# Patient Record
Sex: Female | Born: 1935 | Race: White | Hispanic: No | Marital: Married | Smoking: Former smoker
Health system: Southern US, Academic
[De-identification: ages and names within clinical notes are randomized; demographics above are authoritative.]

---

## 1998-05-05 ENCOUNTER — Ambulatory Visit (INDEPENDENT_AMBULATORY_CARE_PROVIDER_SITE_OTHER): Payer: Self-pay

## 1999-07-14 ENCOUNTER — Ambulatory Visit (INDEPENDENT_AMBULATORY_CARE_PROVIDER_SITE_OTHER): Payer: Self-pay

## 1999-08-18 ENCOUNTER — Ambulatory Visit (INDEPENDENT_AMBULATORY_CARE_PROVIDER_SITE_OTHER): Payer: Self-pay

## 1999-09-08 ENCOUNTER — Other Ambulatory Visit: Payer: Self-pay

## 1999-09-08 ENCOUNTER — Ambulatory Visit (INDEPENDENT_AMBULATORY_CARE_PROVIDER_SITE_OTHER): Payer: Self-pay | Admitting: Family Medicine

## 1999-09-20 ENCOUNTER — Ambulatory Visit (HOSPITAL_COMMUNITY): Payer: Self-pay | Admitting: Pulmonary Disease

## 1999-10-11 ENCOUNTER — Ambulatory Visit (HOSPITAL_COMMUNITY): Payer: Self-pay | Admitting: Pulmonary Disease

## 1999-10-29 ENCOUNTER — Ambulatory Visit (INDEPENDENT_AMBULATORY_CARE_PROVIDER_SITE_OTHER): Payer: Self-pay | Admitting: Family Medicine

## 1999-11-22 ENCOUNTER — Ambulatory Visit (INDEPENDENT_AMBULATORY_CARE_PROVIDER_SITE_OTHER): Payer: Self-pay

## 1999-12-03 ENCOUNTER — Ambulatory Visit (HOSPITAL_BASED_OUTPATIENT_CLINIC_OR_DEPARTMENT_OTHER): Payer: Self-pay | Admitting: Family Medicine

## 1999-12-24 ENCOUNTER — Ambulatory Visit (INDEPENDENT_AMBULATORY_CARE_PROVIDER_SITE_OTHER): Payer: Self-pay | Admitting: Family Medicine

## 2000-01-06 ENCOUNTER — Ambulatory Visit (INDEPENDENT_AMBULATORY_CARE_PROVIDER_SITE_OTHER): Payer: Self-pay

## 2000-02-15 ENCOUNTER — Ambulatory Visit (INDEPENDENT_AMBULATORY_CARE_PROVIDER_SITE_OTHER): Payer: Self-pay

## 2000-03-07 ENCOUNTER — Ambulatory Visit (HOSPITAL_BASED_OUTPATIENT_CLINIC_OR_DEPARTMENT_OTHER): Payer: Self-pay | Admitting: Dermatology

## 2000-03-14 ENCOUNTER — Ambulatory Visit (HOSPITAL_BASED_OUTPATIENT_CLINIC_OR_DEPARTMENT_OTHER): Payer: Self-pay | Admitting: Dermatology

## 2000-03-17 ENCOUNTER — Ambulatory Visit (INDEPENDENT_AMBULATORY_CARE_PROVIDER_SITE_OTHER): Payer: Self-pay

## 2000-03-24 ENCOUNTER — Ambulatory Visit (INDEPENDENT_AMBULATORY_CARE_PROVIDER_SITE_OTHER): Payer: Self-pay | Admitting: Family Medicine

## 2000-04-10 ENCOUNTER — Ambulatory Visit (HOSPITAL_COMMUNITY): Payer: Self-pay | Admitting: Pulmonary Disease

## 2000-08-28 ENCOUNTER — Ambulatory Visit (HOSPITAL_COMMUNITY): Payer: Self-pay | Admitting: Pulmonary Disease

## 2000-10-26 ENCOUNTER — Ambulatory Visit (INDEPENDENT_AMBULATORY_CARE_PROVIDER_SITE_OTHER): Payer: Self-pay

## 2001-01-29 ENCOUNTER — Ambulatory Visit (HOSPITAL_COMMUNITY): Payer: Self-pay | Admitting: Pulmonary Disease

## 2001-02-08 ENCOUNTER — Ambulatory Visit (INDEPENDENT_AMBULATORY_CARE_PROVIDER_SITE_OTHER): Payer: Self-pay

## 2001-03-07 ENCOUNTER — Other Ambulatory Visit: Payer: Self-pay

## 2001-03-07 ENCOUNTER — Ambulatory Visit (HOSPITAL_BASED_OUTPATIENT_CLINIC_OR_DEPARTMENT_OTHER): Payer: Self-pay | Admitting: Family Medicine

## 2001-03-09 ENCOUNTER — Ambulatory Visit (INDEPENDENT_AMBULATORY_CARE_PROVIDER_SITE_OTHER): Payer: Self-pay | Admitting: Family Medicine

## 2001-04-24 ENCOUNTER — Ambulatory Visit (INDEPENDENT_AMBULATORY_CARE_PROVIDER_SITE_OTHER): Payer: Self-pay | Admitting: DERMATOLOGY

## 2001-07-23 ENCOUNTER — Ambulatory Visit (HOSPITAL_COMMUNITY): Payer: Self-pay | Admitting: Pulmonary Disease

## 2001-07-30 ENCOUNTER — Ambulatory Visit (INDEPENDENT_AMBULATORY_CARE_PROVIDER_SITE_OTHER): Payer: Self-pay | Admitting: Family Medicine

## 2001-08-14 ENCOUNTER — Ambulatory Visit (INDEPENDENT_AMBULATORY_CARE_PROVIDER_SITE_OTHER): Payer: Self-pay | Admitting: Family Medicine

## 2001-11-13 ENCOUNTER — Ambulatory Visit (INDEPENDENT_AMBULATORY_CARE_PROVIDER_SITE_OTHER): Payer: Self-pay | Admitting: Family Medicine

## 2001-11-23 ENCOUNTER — Ambulatory Visit (INDEPENDENT_AMBULATORY_CARE_PROVIDER_SITE_OTHER): Payer: Self-pay | Admitting: Family Medicine

## 2001-12-28 ENCOUNTER — Ambulatory Visit (INDEPENDENT_AMBULATORY_CARE_PROVIDER_SITE_OTHER): Payer: Self-pay | Admitting: Family Medicine

## 2002-01-21 ENCOUNTER — Ambulatory Visit (HOSPITAL_COMMUNITY): Payer: Self-pay | Admitting: Pulmonary Disease

## 2002-03-05 ENCOUNTER — Ambulatory Visit (INDEPENDENT_AMBULATORY_CARE_PROVIDER_SITE_OTHER): Payer: Self-pay | Admitting: Family Medicine

## 2002-03-08 ENCOUNTER — Ambulatory Visit (INDEPENDENT_AMBULATORY_CARE_PROVIDER_SITE_OTHER): Payer: Self-pay

## 2002-05-14 ENCOUNTER — Ambulatory Visit (INDEPENDENT_AMBULATORY_CARE_PROVIDER_SITE_OTHER): Payer: Self-pay | Admitting: Family Medicine

## 2002-05-21 ENCOUNTER — Ambulatory Visit (INDEPENDENT_AMBULATORY_CARE_PROVIDER_SITE_OTHER): Payer: Self-pay | Admitting: Family Medicine

## 2002-05-31 ENCOUNTER — Ambulatory Visit (INDEPENDENT_AMBULATORY_CARE_PROVIDER_SITE_OTHER): Payer: Self-pay | Admitting: Family Medicine

## 2002-06-04 ENCOUNTER — Ambulatory Visit (INDEPENDENT_AMBULATORY_CARE_PROVIDER_SITE_OTHER): Payer: Self-pay

## 2002-07-22 ENCOUNTER — Ambulatory Visit (HOSPITAL_COMMUNITY): Payer: Self-pay | Admitting: Pulmonary Disease

## 2002-07-23 ENCOUNTER — Ambulatory Visit (INDEPENDENT_AMBULATORY_CARE_PROVIDER_SITE_OTHER): Payer: Self-pay | Admitting: Family Medicine

## 2002-09-20 ENCOUNTER — Ambulatory Visit (INDEPENDENT_AMBULATORY_CARE_PROVIDER_SITE_OTHER): Payer: Self-pay | Admitting: Family Medicine

## 2003-01-03 ENCOUNTER — Ambulatory Visit (INDEPENDENT_AMBULATORY_CARE_PROVIDER_SITE_OTHER): Payer: Self-pay | Admitting: Family Medicine

## 2003-01-22 ENCOUNTER — Ambulatory Visit (HOSPITAL_COMMUNITY): Payer: Self-pay | Admitting: Pulmonary Disease

## 2003-02-17 ENCOUNTER — Ambulatory Visit (INDEPENDENT_AMBULATORY_CARE_PROVIDER_SITE_OTHER): Payer: Self-pay | Admitting: Family Medicine

## 2003-03-07 ENCOUNTER — Ambulatory Visit (INDEPENDENT_AMBULATORY_CARE_PROVIDER_SITE_OTHER): Payer: Self-pay | Admitting: Family Medicine

## 2003-05-28 ENCOUNTER — Ambulatory Visit (INDEPENDENT_AMBULATORY_CARE_PROVIDER_SITE_OTHER): Payer: Self-pay | Admitting: Family Medicine

## 2003-06-23 ENCOUNTER — Ambulatory Visit (INDEPENDENT_AMBULATORY_CARE_PROVIDER_SITE_OTHER): Payer: Self-pay | Admitting: Family Medicine

## 2003-07-21 ENCOUNTER — Ambulatory Visit (HOSPITAL_COMMUNITY): Payer: Self-pay | Admitting: Pulmonary Disease

## 2003-10-13 ENCOUNTER — Ambulatory Visit (INDEPENDENT_AMBULATORY_CARE_PROVIDER_SITE_OTHER): Payer: Self-pay | Admitting: Family Medicine

## 2004-01-26 ENCOUNTER — Ambulatory Visit (HOSPITAL_COMMUNITY): Payer: Self-pay | Admitting: Pulmonary Disease

## 2004-03-16 ENCOUNTER — Ambulatory Visit (INDEPENDENT_AMBULATORY_CARE_PROVIDER_SITE_OTHER): Payer: Self-pay

## 2004-06-16 ENCOUNTER — Other Ambulatory Visit (HOSPITAL_COMMUNITY): Payer: Self-pay

## 2004-07-13 ENCOUNTER — Ambulatory Visit (INDEPENDENT_AMBULATORY_CARE_PROVIDER_SITE_OTHER): Payer: Self-pay

## 2004-08-09 ENCOUNTER — Ambulatory Visit (INDEPENDENT_AMBULATORY_CARE_PROVIDER_SITE_OTHER): Payer: Self-pay

## 2004-09-06 ENCOUNTER — Ambulatory Visit (HOSPITAL_COMMUNITY): Payer: Self-pay

## 2004-09-09 ENCOUNTER — Ambulatory Visit (INDEPENDENT_AMBULATORY_CARE_PROVIDER_SITE_OTHER): Payer: Self-pay

## 2004-09-21 ENCOUNTER — Other Ambulatory Visit (INDEPENDENT_AMBULATORY_CARE_PROVIDER_SITE_OTHER): Payer: Self-pay

## 2004-09-24 ENCOUNTER — Ambulatory Visit (INDEPENDENT_AMBULATORY_CARE_PROVIDER_SITE_OTHER): Payer: Self-pay | Admitting: Family Medicine

## 2004-10-14 ENCOUNTER — Ambulatory Visit (INDEPENDENT_AMBULATORY_CARE_PROVIDER_SITE_OTHER): Payer: Self-pay

## 2005-04-05 ENCOUNTER — Ambulatory Visit (INDEPENDENT_AMBULATORY_CARE_PROVIDER_SITE_OTHER): Payer: Self-pay | Admitting: Infectious Disease

## 2005-07-06 ENCOUNTER — Ambulatory Visit (INDEPENDENT_AMBULATORY_CARE_PROVIDER_SITE_OTHER): Payer: Self-pay | Admitting: Infectious Disease

## 2005-07-11 ENCOUNTER — Ambulatory Visit (INDEPENDENT_AMBULATORY_CARE_PROVIDER_SITE_OTHER): Payer: Self-pay | Admitting: Family Medicine

## 2005-07-26 ENCOUNTER — Ambulatory Visit (HOSPITAL_BASED_OUTPATIENT_CLINIC_OR_DEPARTMENT_OTHER): Payer: Self-pay | Admitting: Dermatology

## 2005-08-19 ENCOUNTER — Ambulatory Visit (INDEPENDENT_AMBULATORY_CARE_PROVIDER_SITE_OTHER): Payer: Self-pay

## 2005-09-06 ENCOUNTER — Ambulatory Visit (INDEPENDENT_AMBULATORY_CARE_PROVIDER_SITE_OTHER): Payer: Self-pay | Admitting: Infectious Disease

## 2005-09-08 ENCOUNTER — Ambulatory Visit (INDEPENDENT_AMBULATORY_CARE_PROVIDER_SITE_OTHER): Payer: Self-pay | Admitting: Infectious Disease

## 2005-09-28 ENCOUNTER — Ambulatory Visit (INDEPENDENT_AMBULATORY_CARE_PROVIDER_SITE_OTHER): Payer: Self-pay | Admitting: Family Medicine

## 2005-10-10 ENCOUNTER — Ambulatory Visit (HOSPITAL_COMMUNITY): Payer: Self-pay | Admitting: Pulmonary Disease

## 2005-10-20 ENCOUNTER — Ambulatory Visit (HOSPITAL_COMMUNITY): Payer: Self-pay | Admitting: Pulmonary Disease

## 2006-02-21 ENCOUNTER — Ambulatory Visit (INDEPENDENT_AMBULATORY_CARE_PROVIDER_SITE_OTHER): Payer: Self-pay | Admitting: Infectious Disease

## 2006-03-09 ENCOUNTER — Ambulatory Visit (INDEPENDENT_AMBULATORY_CARE_PROVIDER_SITE_OTHER): Payer: Self-pay | Admitting: Infectious Disease

## 2006-03-21 ENCOUNTER — Ambulatory Visit (INDEPENDENT_AMBULATORY_CARE_PROVIDER_SITE_OTHER): Payer: Self-pay

## 2006-04-17 ENCOUNTER — Ambulatory Visit (HOSPITAL_COMMUNITY): Payer: Self-pay | Admitting: Pulmonary Disease

## 2006-08-01 ENCOUNTER — Other Ambulatory Visit (INDEPENDENT_AMBULATORY_CARE_PROVIDER_SITE_OTHER): Payer: Self-pay | Admitting: Infectious Disease

## 2006-08-21 ENCOUNTER — Ambulatory Visit
Admit: 2006-08-21 | Discharge: 2006-08-21 | Disposition: A | Discharge status: Home / Self Care | Payer: No Typology Code available for payment source | Attending: Infectious Disease | Admitting: Infectious Disease

## 2006-08-21 DIAGNOSIS — A319 Mycobacterial infection, unspecified: Secondary | ICD-10-CM | POA: Insufficient documentation

## 2006-09-08 ENCOUNTER — Ambulatory Visit
Admission: RE | Admit: 2006-09-08 | Discharge: 2006-09-08 | Disposition: A | Discharge status: Home / Self Care | Payer: No Typology Code available for payment source | Attending: Infectious Disease | Admitting: Infectious Disease

## 2006-09-08 DIAGNOSIS — A318 Other mycobacterial infections: Secondary | ICD-10-CM | POA: Insufficient documentation

## 2006-09-13 ENCOUNTER — Encounter (INDEPENDENT_AMBULATORY_CARE_PROVIDER_SITE_OTHER): Payer: Self-pay | Admitting: Infectious Disease

## 2006-09-14 ENCOUNTER — Encounter (INDEPENDENT_AMBULATORY_CARE_PROVIDER_SITE_OTHER): Payer: No Typology Code available for payment source | Admitting: Infectious Disease

## 2006-10-16 ENCOUNTER — Encounter (INDEPENDENT_AMBULATORY_CARE_PROVIDER_SITE_OTHER): Payer: No Typology Code available for payment source | Admitting: Pulmonary Disease

## 2006-10-25 ENCOUNTER — Ambulatory Visit
Admission: RE | Admit: 2006-10-25 | Discharge: 2006-10-25 | Disposition: A | Discharge status: Home / Self Care | Payer: No Typology Code available for payment source

## 2006-10-25 ENCOUNTER — Other Ambulatory Visit (HOSPITAL_COMMUNITY): Payer: Self-pay

## 2006-10-25 DIAGNOSIS — Z1231 Encounter for screening mammogram for malignant neoplasm of breast: Secondary | ICD-10-CM | POA: Insufficient documentation

## 2006-10-25 DIAGNOSIS — Z803 Family history of malignant neoplasm of breast: Secondary | ICD-10-CM | POA: Insufficient documentation

## 2007-03-12 ENCOUNTER — Encounter (INDEPENDENT_AMBULATORY_CARE_PROVIDER_SITE_OTHER): Payer: No Typology Code available for payment source | Admitting: Pulmonary Disease

## 2007-03-21 ENCOUNTER — Ambulatory Visit
Admission: RE | Admit: 2007-03-21 | Discharge: 2007-03-21 | Disposition: A | Discharge status: Home / Self Care | Payer: No Typology Code available for payment source | Attending: Dermatology | Admitting: Dermatology

## 2007-03-21 ENCOUNTER — Other Ambulatory Visit (INDEPENDENT_AMBULATORY_CARE_PROVIDER_SITE_OTHER): Payer: Self-pay | Admitting: Family Medicine

## 2007-03-21 DIAGNOSIS — L820 Inflamed seborrheic keratosis: Secondary | ICD-10-CM | POA: Insufficient documentation

## 2007-03-26 LAB — SURGICAL PATHOLOGY SPECIMEN

## 2007-09-25 ENCOUNTER — Encounter (HOSPITAL_BASED_OUTPATIENT_CLINIC_OR_DEPARTMENT_OTHER): Payer: Self-pay | Admitting: Dermatology

## 2007-09-25 ENCOUNTER — Ambulatory Visit
Admission: RE | Admit: 2007-09-25 | Discharge: 2007-09-25 | Disposition: A | Discharge status: Home / Self Care | Payer: Medicare Other | Attending: Dermatology | Admitting: Dermatology

## 2007-09-25 DIAGNOSIS — L821 Other seborrheic keratosis: Secondary | ICD-10-CM | POA: Insufficient documentation

## 2007-09-25 DIAGNOSIS — Z85828 Personal history of other malignant neoplasm of skin: Secondary | ICD-10-CM | POA: Insufficient documentation

## 2007-09-25 DIAGNOSIS — Z09 Encounter for follow-up examination after completed treatment for conditions other than malignant neoplasm: Secondary | ICD-10-CM | POA: Insufficient documentation

## 2007-09-25 NOTE — Progress Notes (Signed)
[facility]      CANCER CENTER NOTE    PATIENT NAME: Lindsay Ryan, Lindsay Ryan [facility] ZOXWRU:[id number]  DATE OF SERVICE:09/25/2007  DATE OF BIRTH: Oct 11, 1933    SUBJECTIVE: The patient is a 74 year old female with a history of multiple non-melanoma skin cancers. She is here for her routine checkup.    OBJECTIVE: Examination of the face, arms, chest and back reveals no abnormalities or any recurrences of the skin cancers except for multiple dark, stuck-on, keratotic lesions on her chest and back.    ASSESSMENT: The patient has a history of multiple basal cell carcinomas. She has multiple seborrheic keratoses of her chest and back.    PLAN: I would like to see the patient every 6 months. She is moving to [place]. She plans on frequenting [place]. On her returns to [facility], she is to set up a visit to see me approximately every 6 months.      Evalee Mutton, MD  Professor  [facility] Department of Medicine    [id number]; D: 09/25/2007 10:54:45 T: 09/25/2007 20:06:10

## 2007-09-26 ENCOUNTER — Ambulatory Visit (HOSPITAL_BASED_OUTPATIENT_CLINIC_OR_DEPARTMENT_OTHER): Payer: Medicare Other | Admitting: Dermatology

## 2007-11-05 ENCOUNTER — Ambulatory Visit (INDEPENDENT_AMBULATORY_CARE_PROVIDER_SITE_OTHER): Payer: Self-pay | Admitting: Infectious Disease

## 2007-11-05 NOTE — Telephone Encounter (Signed)
Triage Queue message copied by Baker Pierini on Mon Nov 05, 2007 9:53 AM  ------   Message from: Gibson Ramp   Created: Mon Nov 05, 2007 9:53 AM    >> Gibson Ramp ZOX Nov 05, 2007 9:53 am  Dr.Sarwari  The pt states that she had a ct scan done and there are some changes. She is requesting a return call.

## 2007-11-05 NOTE — Telephone Encounter (Signed)
Called and discussed with patient - she recently had a regular physical at [facility] and based on symptoms of mild cough, had a repeat CT chest.  This has not yet been compared to her last CT with [place] in April 2008 but she is coordinating this comparision.  There were some concern about the activity/relapse of her pulmonary MAC which had been treated with 2 years of Azithromycin/Etambutol till last year.    Discussed and agreed to proceed with collection of 3 AFB specimens to document any microbiological relapse.

## 2007-11-12 ENCOUNTER — Other Ambulatory Visit
Admission: RE | Admit: 2007-11-12 | Discharge: 2007-11-12 | Disposition: A | Discharge status: Home / Self Care | Payer: Medicare FFS | Attending: Infectious Disease | Admitting: Infectious Disease

## 2007-11-12 DIAGNOSIS — Z79899 Other long term (current) drug therapy: Secondary | ICD-10-CM | POA: Insufficient documentation

## 2007-11-15 ENCOUNTER — Other Ambulatory Visit (HOSPITAL_COMMUNITY): Payer: Self-pay

## 2007-11-20 ENCOUNTER — Ambulatory Visit
Admission: RE | Admit: 2007-11-20 | Discharge: 2007-11-20 | Disposition: A | Discharge status: Home / Self Care | Payer: Medicare FFS

## 2007-11-20 ENCOUNTER — Ambulatory Visit (INDEPENDENT_AMBULATORY_CARE_PROVIDER_SITE_OTHER)
Admission: RE | Admit: 2007-11-20 | Discharge: 2007-11-20 | Disposition: A | Discharge status: Home / Self Care | Payer: Medicare FFS | Source: Ambulatory Visit | Admitting: Rheumatology

## 2007-11-20 DIAGNOSIS — E7800 Pure hypercholesterolemia, unspecified: Secondary | ICD-10-CM | POA: Insufficient documentation

## 2007-11-20 DIAGNOSIS — Z5181 Encounter for therapeutic drug level monitoring: Secondary | ICD-10-CM | POA: Insufficient documentation

## 2007-11-20 DIAGNOSIS — Z1231 Encounter for screening mammogram for malignant neoplasm of breast: Secondary | ICD-10-CM | POA: Insufficient documentation

## 2007-11-20 DIAGNOSIS — Z803 Family history of malignant neoplasm of breast: Secondary | ICD-10-CM | POA: Insufficient documentation

## 2007-11-20 LAB — HEPATIC FUNCTION PANEL
ALBUMIN: 3.4 g/dL (ref 3.2–4.4)
ALKALINE PHOSPHATASE: 103 U/L (ref 38–126)
ALT (SGPT): 23 U/L (ref 6–35)
AST (SGOT): 23 U/L (ref 5–30)
BILIRUBIN,CONJUGATED: 0.1 mg/dL (ref 0.0–0.3)
TOTAL PROTEIN: 6.8 g/dL (ref 6.4–8.3)

## 2007-11-20 LAB — LIPID PANEL
CHOLESTEROL: 177 mg/dL (ref <200)
LDL (CALCULATED): 116 mg/dL — ABNORMAL HIGH (ref <100)

## 2007-11-20 LAB — CREATINE KINASE (CK), TOTAL, SERUM OR PLASMA: CREATINE KINASE (CK): 59 U/L (ref 24–170)

## 2007-11-23 ENCOUNTER — Ambulatory Visit
Admission: RE | Admit: 2007-11-23 | Discharge: 2007-11-23 | Disposition: A | Discharge status: Home / Self Care | Payer: Medicare FFS

## 2007-11-28 LAB — AFB CULTURE WITH STAIN: ACID FAST SMEAR: NONE SEEN

## 2007-11-28 LAB — MYCOBACTERIAL IDENTIFICATION

## 2007-11-30 ENCOUNTER — Ambulatory Visit
Admission: RE | Admit: 2007-11-30 | Discharge: 2007-11-30 | Disposition: A | Discharge status: Home / Self Care | Payer: Medicare Other

## 2007-12-10 ENCOUNTER — Encounter (INDEPENDENT_AMBULATORY_CARE_PROVIDER_SITE_OTHER): Payer: Medicare Other | Admitting: Pulmonary Disease

## 2007-12-21 ENCOUNTER — Encounter (INDEPENDENT_AMBULATORY_CARE_PROVIDER_SITE_OTHER): Payer: Medicare FFS | Admitting: Infectious Disease

## 2007-12-24 ENCOUNTER — Ambulatory Visit
Admission: RE | Admit: 2007-12-24 | Discharge: 2007-12-24 | Disposition: A | Discharge status: Home / Self Care | Payer: Medicare FFS | Attending: Infectious Disease | Admitting: Infectious Disease

## 2007-12-24 DIAGNOSIS — A310 Pulmonary mycobacterial infection: Secondary | ICD-10-CM | POA: Insufficient documentation

## 2007-12-24 LAB — ARUP MISC INT TEST 1-AMBIENT: ARUP TEST NUMBER: 60217

## 2007-12-24 LAB — AFB CULTURE WITH STAIN
ACID FAST SMEAR: NONE SEEN
ACID FAST SMEAR: NONE SEEN
CULTURE OBSERVATION: NO GROWTH
CULTURE OBSERVATION: NO GROWTH
REPORT STATUS: 8172009
REPORT STATUS: 8172009

## 2007-12-25 ENCOUNTER — Ambulatory Visit
Admission: RE | Admit: 2007-12-25 | Discharge: 2007-12-25 | Disposition: A | Discharge status: Home / Self Care | Payer: Medicare FFS | Attending: Infectious Disease | Admitting: Infectious Disease

## 2007-12-25 DIAGNOSIS — A310 Pulmonary mycobacterial infection: Secondary | ICD-10-CM | POA: Insufficient documentation

## 2007-12-25 NOTE — Progress Notes (Signed)
 [facility] Department of Medicine  [address]  [place], [place] [address]      PROGRESS NOTE    PATIENT NAME: Lindsay Ryan, Lindsay Ryan [facility]  CHART NUMBER: [id number]  DATE OF BIRTH: 17-Sep-1933  DATE OF SERVICE: 12/21/2007    IDENTIFYING DATA:  Lindsay Ryan is a 74 year old Caucasian female who follows up with myself and the Pulmonary Clinic for an underlying diagnosis of pulmonary Mycobacterium avium complex disease.  Her initial presentation was with a chronic cough with CT chest findings of bronchiectasis and pulmonary nodules.  In May 2008, the patient completed a two-year course of antibacterial therapy with azithromycin  500 mg per day along with Ethambutol  800 mg per day.  This was a modified regimen because her initial three drug regimen was complicated by drug allergies that included a rash, possibly secondary to clarithromycin , and elevated liver enzymes associated with rifampin and rifabutin.    In the past few weeks, the patient has been doing quite well physically.  She had a routine annual screening physical at the [facility], and based on symptoms of slight worsening in her cough underwent a repeat CT scan of her chest.  Based on the findings of this CT scan she was advised to seek consultation with myself for concerns that she may have relapsed pulmonary MAC.    We did have a subsequent telephone conversation and I had recommended to the patient that we should send off three sputum AFB samples, which she did in early July.  Subsequently, one of these three samples turned positive for Mycobacterium avium complex with susceptibilities currently pending.    SUBJECTIVE:  The patient presents to the clinic today to discuss our further course of action.  Again, she has been relatively asymptomatic, except for a slightly more prominent cough.  Her cough is not as bad as it was when we initially treated her for pulmonary Mycobacterium avium complex disease.  She does, however, get spells when the cough does  get worse.  She also has some exertional shortness of breath that has not changed by much.  She denies any drenching night sweats.  She otherwise has a good appetite and has been able to maintain a constant weight.  She overall does not feel sick.  However, she would like to consider repeat therapy if it is likely to prevent further worsening of her pulmonary process with worsening symptomatology.    REVIEW OF SYSTEMS:  Negative for any fever, chills or night sweats.  She retains a good appetite.  She does not have any chest pain.    OBJECTIVE:  On physical examination, she does indeed look well.  Her vital signs show a height of 5 foot 6.5 inches, weight 151.4 pounds, temperature 37.2 degrees centigrade, blood pressure of 126/72 mmHg.  Her general physical exam does not show any evidence of pallor, cyanosis, jaundice, lymphadenopathy, clubbing or lower extremity edema.  HEENT examination is unremarkable.  Chest exam showed kyphosis.  She has good bilateral air entry with normal vesicular breath sounds.  I am unable to hear any crackles.   Cardiovascular exam shows normal S1 and S2 with no murmurs.  Her abdomen is soft and nontender.  Extremity exam does not show any evidence of arthritis.  Skin exam shows no rash.    TESTS:  A review of her labs done on July 17th show a total bilirubin of 1.3, AST/ALT 23/33, and an alkaline phosphatase of 103.  CPK was 59.  Once again 3 sets of  sputum AFB were sent of which 1/3 is positive for MAC.      The patient also brings reports of her CT scan done at the [facility] on 10/04/07.   The scan interpretation shows multifocal bilateral pulmonary bronchiectasis and nodular opacities with tree in bud pattern.  There is also focal subsegmental airspace infiltrate in the superior segment of the right lower lobe.  This scan was subsequently compared to her end of treatment CT scan from [facility] done on 08/21/2006.  Based on this comparison, the report states that the multifocal  pulmonary bronchiectasis and nodular opacities were present previously and appear stable.  However, the airspace infiltrate in the superior segment of the right lower lobe does represent an interval change, likely reflecting active disease.    ASSESSMENT:  In summary, Lindsay Ryan is a 74 year old Caucasian female with underlying kyphosis and nodular and bronchiectatic pulmonary changes secondary to Mycobacterium avium complex disease, relapsed after a two-year treatment with azithromycin  and Ethambutol  that was completed in August 2008, now with CT and microbiologic evidence of relapse and progression.      PLAN:  I had a detailed and extensive discussion with the patient regarding our future course of action.  We are both inclined to consider retreatment, especially given that she is slightly symptomatic.  I suggested to the patient that we should wait until we have all susceptibility data.  I also suggested that I would like to collect three more sputum samples.  For our retreatment regimen, I would like to consider a rechallenge for the patient with clarithromycin .  If she does tolerate it then I would add Ethambutol  and potentially use a quinolone as a third drug.  I plan to follow up with the patient in another 2 months' time.      [name], MD  Associate Professor; Section of Infectious Disease  [place] Department of Medicine    [id number]; D: 12/21/2007 12:37:33; T: 12/25/2007 08:40:21    cc: Norleen Kitty MD      Lindsay Ryan

## 2007-12-26 ENCOUNTER — Other Ambulatory Visit
Admission: RE | Admit: 2007-12-26 | Discharge: 2007-12-26 | Disposition: A | Discharge status: Home / Self Care | Payer: Medicare FFS | Attending: Infectious Disease | Admitting: Infectious Disease

## 2007-12-26 DIAGNOSIS — A310 Pulmonary mycobacterial infection: Secondary | ICD-10-CM | POA: Insufficient documentation

## 2008-01-17 LAB — AFB CULTURE WITH STAIN: ACID FAST SMEAR: NONE SEEN

## 2008-01-17 LAB — MYCOBACTERIAL IDENTIFICATION

## 2008-01-28 LAB — ARUP MISC INT TEST 1-AMBIENT: ARUP TEST NUMBER: 60217

## 2008-02-01 ENCOUNTER — Telehealth (INDEPENDENT_AMBULATORY_CARE_PROVIDER_SITE_OTHER): Payer: Self-pay | Admitting: Infectious Disease

## 2008-02-01 NOTE — Telephone Encounter (Signed)
Received call from patient - running a temperature and having a productive cough.  Has pulmonary MAC that we are planning on retreating in the next 2 weeks.  Will go ahead and call in Augmentin [phone] BID to [facility].  Patient aware and agreeable.

## 2008-02-06 LAB — AFB CULTURE WITH STAIN
ACID FAST SMEAR: NONE SEEN
CULTURE OBSERVATION: NO GROWTH
REPORT STATUS: 9302009

## 2008-02-07 LAB — AFB CULTURE WITH STAIN: ACID FAST SMEAR: NONE SEEN

## 2008-02-14 ENCOUNTER — Ambulatory Visit (INDEPENDENT_AMBULATORY_CARE_PROVIDER_SITE_OTHER): Payer: Medicare FFS | Admitting: Infectious Disease

## 2008-02-14 MED ORDER — CLARITHROMYCIN 500 MG TABLET
500.00 mg | ORAL_TABLET | Freq: Two times a day (BID) | ORAL | Status: DC
Start: 2008-02-14 — End: 2009-02-26

## 2008-02-14 MED ORDER — ETHAMBUTOL 400 MG TABLET
800.0000 mg | ORAL_TABLET | Freq: Every day | ORAL | Status: DC
Start: 2008-02-14 — End: 2009-02-26

## 2008-02-14 MED ORDER — AZITHROMYCIN 500 MG TABLET
500.00 mg | ORAL_TABLET | ORAL | Status: DC
Start: 2008-02-14 — End: 2008-09-03

## 2008-02-14 MED ORDER — MOXIFLOXACIN 400 MG TABLET
400.0000 mg | ORAL_TABLET | Freq: Every day | ORAL | Status: DC
Start: 2008-02-14 — End: 2009-02-26

## 2008-02-18 NOTE — Progress Notes (Signed)
[facility] Department of Medicine  [address]  [place], [place] [address]      PROGRESS NOTE    PATIENT NAME: Lindsay Ryan, Lindsay Ryan [facility]  CHART NUMBER: [id number]  DATE OF BIRTH: 1934/01/28  DATE OF SERVICE: 02/14/2008    IDENTIFYING DATA:  Ms. Flinchum is a 74 year old Caucasian female under care at the Infectious Disease Clinic for a longstanding history of pulmonary Mycobacterium avium complex disease with CT findings of bronchiectasis and nodules.  The patient completed a two-year course of antibacterials with azithromycin and Ethambutol.  Her last antimicrobials were in May 2008.  Her course was complicated by possible clarithromycin related rash and elevated AST, ALT associated with rifampin/rifabutin.    SUBJECTIVE:  The patient subsequently remained relatively well without major symptomatology.  However, more recently she has had some relapsed cough symptoms and on a CT scan of the chest done at [facility] had changes concerning for reactivated disease.  This was followed up by 3 sputum AFB samples in July 2009 of which one was positive.  In August 2009, I again repeated three sputum AFB samples of which two were positive for Mycobacterium avium intracellulare.  Her organism remains susceptible to clarithromycin and has an MIC to moxifloxacin of less than 0.12.    The patient recently had an URI illness associated with bronchitis for which she was prescribed azithromycin.  She feels she is recuperating.  She is in clinic today to discuss restarting MAC treatment.    OBJECTIVE:  She does have a hoarse voice.  Her vital signs show a height of 5 feet 6-1/2 inches, weight 152 pounds, temperature 37.3 degrees centigrade, blood pressure 118/76 mmHg.  General and systemic examination is relatively unremarkable.  She does have a clear chest to auscultation.    LABS:  Review of her blood work dated July 2009 shows a total bilirubin 1.3, AST/ALT 23/23.  Her sputum AFB culture data was verified as above.     ASSESSMENT AND PLAN:  Ms. Lecompte is a 74 year old Caucasian female with relapsed pulmonary Mycobacterium avium complex disease with CT evidence of active infection verified by microbiological specimens with one of three sputum samples positive in July 2009, and two of three positive in August 2009 for a clarithromycin sensitive strain of Mycobacterium avium intracellulare.      I had a detailed and extensive discussion with the patient regarding our subsequent course of action.  Given the fact that we are not very clear as to whether it was clarithromycin that gave her a rash or rifampin, the patient and I are both willing to reconsider challenge with clarithromycin, given that most data on MAC treatment has been with clarithromycin.  To this effect, we have decided on the following plan:    1.  Restart treatment with introduction of one drug at a time.  We will start with clarithromycin 500 mg b.i.d.  I did give the patient a one-month prescription with a year's worth of refills.  On the off chance that she does again get a rash with the clarithromycin, the patient will let me know and switch to azithromycin 500 mg per day which she has previously tolerated very well.    2.  Within 7 to 10 days of tolerating her macrolide, the patient will go ahead and start Ethambutol at 800 mg daily.    3.  Within three to five days of tolerating her Ethambutol, the patient will go ahead and start moxifloxacin 400 mg daily.  She was  given prescriptions for all these medications with enough for one month with 11 refills.    4.  We plan to treat for a minimum of one and one-half years.  I do plan to check radiological investigations at about six months into treatment to monitor evidence of improvement.    5.  The patient is to return to clinic in four to six weeks' time, at which point we will check her CBC, creatinine and LFTs.    6.  The patient was also administered a flu vaccine during this clinic visit.       Makari Portman R. Madolyn Frieze, MD  Associate Professor; Section of Infectious Disease  [place] Department of Medicine    [id number]; D: 02/14/2008 15:47:42; T: 02/18/2008 10:29:33

## 2008-03-13 ENCOUNTER — Ambulatory Visit
Admission: RE | Admit: 2008-03-13 | Discharge: 2008-03-13 | Disposition: A | Discharge status: Home / Self Care | Payer: Medicare FFS | Attending: Infectious Disease | Admitting: Infectious Disease

## 2008-03-13 ENCOUNTER — Encounter (INDEPENDENT_AMBULATORY_CARE_PROVIDER_SITE_OTHER): Payer: Medicare FFS | Admitting: Infectious Disease

## 2008-03-13 DIAGNOSIS — A310 Pulmonary mycobacterial infection: Secondary | ICD-10-CM | POA: Insufficient documentation

## 2008-03-13 LAB — CBC/DIFF
BASOPHILS: 2 % — ABNORMAL HIGH (ref 0–1)
BASOS ABS: 0.155 THOU/uL (ref 0.0–0.2)
EOS ABS: 0.245 THOU/uL (ref 0.1–0.3)
EOSINOPHIL: 3 % (ref 1–6)
HCT: 39.1 % (ref 33.5–45.2)
HGB: 12.7 g/dL (ref 11.5–15.2)
LYMPHOCYTES: 33 % (ref 20–45)
LYMPHS ABS: 2.56 THOU/uL (ref 1.0–4.8)
MCH: 28 pg (ref 27.4–33.0)
MCHC: 32.6 g/dL (ref 31.6–35.5)
MCV: 85.9 fL (ref 82.0–99.0)
MONOCYTES: 10 % (ref 4–13)
MONOS ABS: 0.798 THOU/uL (ref 0.1–0.9)
MPV: 7.2 FL — ABNORMAL LOW (ref 7.4–10.4)
PLATELET COUNT: 259 THO/UL (ref 140–450)
PMN ABS: 3.94 THOU/uL (ref 1.5–7.7)
PMN'S: 52 % (ref 40–75)
RBC: 4.56 MIL/uL (ref 3.84–5.04)
RDW: 13.9 % (ref 10.2–14.0)
WBC: 7.7 THOU/UL (ref 3.5–11.0)

## 2008-03-13 LAB — ALT (SGPT): ALT (SGPT): 33 U/L (ref 6–35)

## 2008-03-13 LAB — BILIRUBIN TOTAL: BILIRUBIN, TOTAL: 0.9 mg/dL (ref 0.3–1.3)

## 2008-03-13 LAB — CREATININE
CREATININE: 0.74 mg/dL (ref 0.49–1.10)
ESTIMATED GLOMERULAR FILTRATION RATE: >59 mL/min/{1.73_m2} (ref >59)

## 2008-03-13 LAB — AST (SGOT): AST (SGOT): 25 U/L (ref 5–30)

## 2008-04-09 ENCOUNTER — Ambulatory Visit
Admission: RE | Admit: 2008-04-09 | Discharge: 2008-04-09 | Disposition: A | Discharge status: Home / Self Care | Payer: Medicare FFS | Attending: Infectious Disease | Admitting: Infectious Disease

## 2008-04-09 DIAGNOSIS — E7800 Pure hypercholesterolemia, unspecified: Secondary | ICD-10-CM | POA: Insufficient documentation

## 2008-04-09 LAB — CBC/DIFF
BASOPHILS: 1 % (ref 0–1)
BASOS ABS: 0.065 THOU/uL (ref 0.0–0.2)
EOS ABS: 0.207 THOU/uL (ref 0.1–0.3)
EOSINOPHIL: 4 % (ref 1–6)
HCT: 37.5 % (ref 33.5–45.2)
HGB: 12.5 g/dL (ref 11.5–15.2)
LYMPHOCYTES: 37 % (ref 20–45)
LYMPHS ABS: 1.91 THOU/uL (ref 1.0–4.8)
MCH: 28.6 pg (ref 27.4–33.0)
MCHC: 33.3 g/dL (ref 31.6–35.5)
MCV: 86.1 fL (ref 82.0–99.0)
MONOCYTES: 9 % (ref 4–13)
MONOS ABS: 0.456 THOU/uL (ref 0.1–0.9)
MPV: 7.5 FL (ref 7.4–10.4)
PLATELET COUNT: 253 THO/UL (ref 140–450)
PMN ABS: 2.46 THOU/uL (ref 1.5–7.7)
PMN'S: 49 % (ref 40–75)
RBC: 4.36 MIL/uL (ref 3.84–5.04)
RDW: 13 % (ref 10.2–14.0)
WBC: 5.1 THOU/UL (ref 3.5–11.0)

## 2008-04-09 LAB — CREATININE
CREATININE: 0.77 mg/dL (ref 0.49–1.10)
ESTIMATED GLOMERULAR FILTRATION RATE: >59 mL/min/{1.73_m2} (ref >59)

## 2008-04-09 LAB — LIPID PANEL
CHOLESTEROL: 228 mg/dL — ABNORMAL HIGH (ref <200)
HDL-CHOLESTEROL: 53 mg/dL (ref >39)
LDL (CALCULATED): 159 mg/dL — ABNORMAL HIGH (ref <100)
TRIGLYCERIDES: 81 mg/dL (ref <150)

## 2008-04-09 LAB — TOTAL PROTEIN: TOTAL PROTEIN: 6.5 g/dL (ref 6.4–8.3)

## 2008-04-09 LAB — CREATINE KINASE (CK), TOTAL, SERUM OR PLASMA: CREATINE KINASE (CK): 51 U/L (ref 24–170)

## 2008-04-09 LAB — AST (SGOT): AST (SGOT): 42 U/L — ABNORMAL HIGH (ref 5–30)

## 2008-04-09 LAB — ALT (SGPT): ALT (SGPT): 64 U/L — ABNORMAL HIGH (ref 6–35)

## 2008-04-09 LAB — BILIRUBIN, CONJUGATED (DIRECT): BILIRUBIN,CONJUGATED: 0.1 mg/dL (ref 0.0–0.3)

## 2008-04-09 LAB — BILIRUBIN TOTAL: BILIRUBIN, TOTAL: 1.3 mg/dL (ref 0.3–1.3)

## 2008-04-09 LAB — ALBUMIN: ALBUMIN: 3.8 g/dL (ref 3.2–4.4)

## 2008-04-09 LAB — ALK PHOS (ALKALINE PHOSPHATASE): ALKALINE PHOSPHATASE: 91 U/L (ref 38–126)

## 2008-05-21 ENCOUNTER — Ambulatory Visit (INDEPENDENT_AMBULATORY_CARE_PROVIDER_SITE_OTHER): Payer: Self-pay | Admitting: Infectious Disease

## 2008-05-21 NOTE — Telephone Encounter (Signed)
Triage Queue message copied by Baker Pierini on Wed May 21, 2008 2:53 PM  ------   Message from: Gibson Ramp   Created: Wed May 21, 2008 2:51 PM    >> Lindsay Ryan May 21, 2008 2:51 pm  Dr.Sarwari  Jasmine December is requesting a diagnosis code for the pt. Please return her call to [phone] ext [phone]

## 2008-06-04 ENCOUNTER — Ambulatory Visit (INDEPENDENT_AMBULATORY_CARE_PROVIDER_SITE_OTHER): Payer: Self-pay | Admitting: Infectious Disease

## 2008-06-04 NOTE — Telephone Encounter (Signed)
Triage Queue message copied by Ileene Hutchinson on Wed Jun 04, 2008 3:02 PM  ------   Message from: Durenda Age   Created: Wed Jun 04, 2008 2:58 PM    >> Lindsay Ryan Jun 04, 2008 2:58 pm  DR.Sarwari  [facility] is calling they need a dignosis code to be able to bill for lab work that was ordered by you and on the script that was dated 03/13/2008.If any questions please contact lab [facility] at [phone] ext [phone]

## 2008-06-25 ENCOUNTER — Ambulatory Visit (INDEPENDENT_AMBULATORY_CARE_PROVIDER_SITE_OTHER): Payer: Self-pay | Admitting: Infectious Disease

## 2008-06-25 NOTE — Telephone Encounter (Signed)
Called [facility] back and spoke with susan to give a dx for the bloodwork that was ordered

## 2008-06-25 NOTE — Telephone Encounter (Signed)
Triage Queue message copied by Baker Pierini on Wed Jun 25, 2008 7:24 AM  ------   Message from: Michel Bickers   Created: Tue Jun 24, 2008 4:08 PM    >> Michel Bickers Tue Jun 24, 2008 4:08 pm  A. Sarwari,    They received orders from you for lab and there is no diagnosis on it.

## 2008-06-25 NOTE — Telephone Encounter (Signed)
Message The Diagnosis is Pulmonary MAC.    Richardine Service  ----- Message -----  From: Baker Pierini  Sent: Jun 25, 2008 7:24 AM  To: Merrily Brittle

## 2008-08-25 ENCOUNTER — Encounter (INDEPENDENT_AMBULATORY_CARE_PROVIDER_SITE_OTHER): Payer: Self-pay | Admitting: Pulmonary Disease

## 2008-09-03 ENCOUNTER — Ambulatory Visit (INDEPENDENT_AMBULATORY_CARE_PROVIDER_SITE_OTHER): Payer: Medicare Other | Admitting: Pulmonary Disease

## 2008-09-03 ENCOUNTER — Encounter (INDEPENDENT_AMBULATORY_CARE_PROVIDER_SITE_OTHER): Payer: Self-pay | Admitting: Pulmonary Disease

## 2008-09-17 ENCOUNTER — Encounter (INDEPENDENT_AMBULATORY_CARE_PROVIDER_SITE_OTHER): Payer: Self-pay | Admitting: Pulmonary Disease

## 2008-09-25 ENCOUNTER — Ambulatory Visit (INDEPENDENT_AMBULATORY_CARE_PROVIDER_SITE_OTHER): Payer: Medicare Other | Admitting: Infectious Disease

## 2008-09-25 VITALS — BP 120/72 | HR 70 | Temp 97.7°F | Ht 66.0 in | Wt 156.0 lb

## 2008-10-14 ENCOUNTER — Other Ambulatory Visit (HOSPITAL_COMMUNITY): Payer: Self-pay

## 2008-11-13 ENCOUNTER — Ambulatory Visit
Admission: RE | Admit: 2008-11-13 | Discharge: 2008-11-13 | Disposition: A | Discharge status: Home / Self Care | Payer: Medicare Other | Attending: Infectious Disease | Admitting: Infectious Disease

## 2008-11-13 DIAGNOSIS — A310 Pulmonary mycobacterial infection: Secondary | ICD-10-CM | POA: Insufficient documentation

## 2008-11-13 LAB — CBC/DIFF
BASOS ABS: 0.051 THOU/uL (ref 0.0–0.2)
EOS ABS: 0.353 10*3/uL — ABNORMAL HIGH (ref 0.1–0.3)
HCT: 38.7 % (ref 33.5–45.2)
HGB: 13.3 g/dL (ref 11.5–15.2)
LYMPHOCYTES: 34 % (ref 20–45)
LYMPHS ABS: 1.91 THOU/uL (ref 1.0–4.8)
MCH: 30.6 pg (ref 27.4–33.0)
MCHC: 34.3 g/dL (ref 31.6–35.5)
MCV: 89.2 fL (ref 82.0–99.0)
MONOS ABS: 0.672 THOU/uL (ref 0.1–0.9)
MPV: 7 FL — ABNORMAL LOW (ref 7.4–10.4)
NRBC'S: 0 /100{WBCs}
PLATELET COUNT: 231 THO/UL (ref 140–450)
PMN ABS: 2.6 THOU/uL (ref 1.5–7.7)
RBC: 4.34 MIL/uL (ref 3.84–5.04)
RDW: 11.7 % (ref 10.2–14.0)
WBC: 5.6 THOU/UL (ref 3.5–11.0)

## 2008-11-13 LAB — CREATININE WITH EGFR
CREATININE: 0.86 mg/dL (ref 0.49–1.10)
ESTIMATED GLOMERULAR FILTRATION RATE: >59 ml/min/1.73m2 (ref >59)

## 2008-11-13 LAB — ALT (SGPT): ALT (SGPT): 31 U/L (ref 7–45)

## 2008-11-21 ENCOUNTER — Ambulatory Visit
Admission: RE | Admit: 2008-11-21 | Discharge: 2008-11-21 | Disposition: A | Discharge status: Home / Self Care | Payer: Medicare Other

## 2008-11-21 DIAGNOSIS — Z859 Personal history of malignant neoplasm, unspecified: Secondary | ICD-10-CM | POA: Insufficient documentation

## 2008-11-21 DIAGNOSIS — Z1231 Encounter for screening mammogram for malignant neoplasm of breast: Secondary | ICD-10-CM | POA: Insufficient documentation

## 2009-02-23 ENCOUNTER — Ambulatory Visit
Admission: RE | Admit: 2009-02-23 | Discharge: 2009-02-23 | Disposition: A | Discharge status: Home / Self Care | Payer: Medicare Other | Attending: Infectious Disease | Admitting: Infectious Disease

## 2009-02-23 DIAGNOSIS — E7800 Pure hypercholesterolemia, unspecified: Secondary | ICD-10-CM | POA: Insufficient documentation

## 2009-02-23 LAB — CBC/DIFF
BASOPHILS: 1 % (ref 0–1)
BASOS ABS: 0.03 THOU/uL (ref 0.0–0.2)
EOS ABS: 0.431 THOU/uL — ABNORMAL HIGH (ref 0.1–0.3)
EOSINOPHIL: 8 % — ABNORMAL HIGH (ref 1–6)
HCT: 39.7 % (ref 33.5–45.2)
HGB: 13.1 g/dL (ref 11.5–15.2)
LYMPHOCYTES: 37 % (ref 20–45)
LYMPHS ABS: 1.99 THOU/uL (ref 1.0–4.8)
MCH: 29.5 pg (ref 27.4–33.0)
MCHC: 33 g/dL (ref 31.6–35.5)
MCV: 89.4 fL (ref 82.0–99.0)
MONOCYTES: 10 % (ref 4–13)
MONOS ABS: 0.515 THOU/uL (ref 0.1–0.9)
MPV: 6.8 FL — ABNORMAL LOW (ref 7.4–10.4)
NRBC'S: 0 /100{WBCs}
PLATELET COUNT: 259 THO/UL (ref 140–450)
PMN ABS: 2.36 THOU/uL (ref 1.5–7.7)
PMN'S: 44 % (ref 40–75)
RBC: 4.45 MIL/uL (ref 3.84–5.04)
RDW: 11.6 % (ref 10.2–14.0)
WBC: 5.3 THOU/UL (ref 3.5–11.0)

## 2009-02-23 LAB — CREATININE WITH EGFR: CREATININE: 0.76 mg/dL (ref 0.49–1.10)

## 2009-02-23 LAB — ALT (SGPT): ALT (SGPT): 46 U/L — ABNORMAL HIGH (ref 7–45)

## 2009-02-23 LAB — LIPID PANEL
HDL-CHOLESTEROL: 51 mg/dL (ref >39)
LDL (CALCULATED): 178 mg/dL — ABNORMAL HIGH (ref <100)
TRIGLYCERIDES: 86 mg/dL (ref <150)
VLDL (CALCULATED): 17 mg/dL (ref <30)

## 2009-02-23 LAB — CREATININE: ESTIMATED GLOMERULAR FILTRATION RATE: >59 mL/min/{1.73_m2} (ref >59)

## 2009-02-23 LAB — BILIRUBIN TOTAL: BILIRUBIN, TOTAL: 1.4 mg/dL — ABNORMAL HIGH (ref 0.3–1.3)

## 2009-02-26 ENCOUNTER — Ambulatory Visit (INDEPENDENT_AMBULATORY_CARE_PROVIDER_SITE_OTHER): Payer: Medicare Other | Admitting: Infectious Disease

## 2009-02-26 VITALS — BP 120/72 | HR 70 | Temp 97.3°F | Wt 158.0 lb

## 2009-02-26 MED ORDER — CLARITHROMYCIN 500 MG TABLET
500.00 mg | ORAL_TABLET | Freq: Two times a day (BID) | ORAL | Status: DC
Start: 2009-02-26 — End: 2010-08-30

## 2009-02-26 MED ORDER — ETHAMBUTOL 400 MG TABLET
800.0000 mg | ORAL_TABLET | Freq: Every day | ORAL | Status: DC
Start: 2009-02-26 — End: 2010-08-30

## 2009-02-26 MED ORDER — MOXIFLOXACIN 400 MG TABLET
400.0000 mg | ORAL_TABLET | Freq: Every day | ORAL | Status: DC
Start: 2009-02-26 — End: 2009-06-10

## 2009-03-02 ENCOUNTER — Encounter (INDEPENDENT_AMBULATORY_CARE_PROVIDER_SITE_OTHER): Payer: Medicare Other | Admitting: Pulmonary Disease

## 2009-03-18 ENCOUNTER — Ambulatory Visit (INDEPENDENT_AMBULATORY_CARE_PROVIDER_SITE_OTHER): Payer: Medicare Other | Admitting: Pulmonary Disease

## 2009-03-18 NOTE — Progress Notes (Signed)
This office note has been dictated.    Mai    On abx now and seeing Dr. Kathie Rhodes    Current outpatient prescriptions   Medication Sig   . Clarithromycin (BIAXIN) 500 mg Tab take 1 Tab by mouth Twice daily.    Marland Kitchen ethambutol (MYAMBUTOL) 400 mg Tab take 2 Tabs by mouth Once a day.    . Moxifloxacin 400 mg Tab take 400 mg by mouth Once a day.    . tiotropium bromide (SPIRIVA HANDIHALER) 18 mcg CpDv take 18 mcg by inalation Once a day.        Doinjg well  Rtc 6 mos  Jimmey Ralph

## 2009-03-26 ENCOUNTER — Encounter (INDEPENDENT_AMBULATORY_CARE_PROVIDER_SITE_OTHER): Payer: Medicare Other | Admitting: Infectious Disease

## 2009-03-30 ENCOUNTER — Encounter (INDEPENDENT_AMBULATORY_CARE_PROVIDER_SITE_OTHER): Payer: Medicare Other | Admitting: Pulmonary Disease

## 2009-04-09 ENCOUNTER — Encounter (INDEPENDENT_AMBULATORY_CARE_PROVIDER_SITE_OTHER): Payer: Medicare Other | Admitting: Infectious Disease

## 2009-07-01 ENCOUNTER — Ambulatory Visit (INDEPENDENT_AMBULATORY_CARE_PROVIDER_SITE_OTHER): Payer: Self-pay | Admitting: Infectious Disease

## 2009-07-01 NOTE — Telephone Encounter (Signed)
Called to let me know that she had to stop the Avelox 3 weeks ago due to a partial tear in her Achiles tendon.  Is still taking her Clarithromycin and Ethambutol.  Plans to return to [facility] in April.  Otherwise doing well.

## 2009-07-01 NOTE — Telephone Encounter (Signed)
Triage Queue message copied by Baker Pierini on Wed Jul 01, 2009 10:47 AM  ------   Message from: Gibson Ramp   Created: Wed Jul 01, 2009 10:37 AM    >> Gibson Ramp Wed Jul 01, 2009 10:37 am  Dr.Sarwari  The pt is requesting a return call, she needs to speak to you. Please call her on her cell number listed

## 2009-08-13 ENCOUNTER — Ambulatory Visit (INDEPENDENT_AMBULATORY_CARE_PROVIDER_SITE_OTHER): Payer: Medicare Other | Admitting: Infectious Disease

## 2009-08-13 ENCOUNTER — Ambulatory Visit
Admission: RE | Admit: 2009-08-13 | Discharge: 2009-08-13 | Disposition: A | Discharge status: Home / Self Care | Payer: Medicare Other | Attending: Infectious Disease | Admitting: Infectious Disease

## 2009-08-13 ENCOUNTER — Encounter (INDEPENDENT_AMBULATORY_CARE_PROVIDER_SITE_OTHER): Payer: Self-pay | Admitting: Infectious Disease

## 2009-08-13 VITALS — BP 100/64 | HR 84 | Temp 98.7°F | Wt 153.0 lb

## 2009-08-13 DIAGNOSIS — A318 Other mycobacterial infections: Secondary | ICD-10-CM | POA: Insufficient documentation

## 2009-08-13 LAB — CBC/DIFF
BASOPHILS: 1 % (ref 0–1)
BASOS ABS: 0.076 THOU/uL (ref 0.0–0.2)
EOS ABS: 0.278 THOU/uL (ref 0.1–0.3)
EOSINOPHIL: 4 % (ref 1–6)
HCT: 39.2 % (ref 33.5–45.2)
HGB: 13.1 g/dL (ref 11.5–15.2)
LYMPHOCYTES: 32 % (ref 20–45)
LYMPHS ABS: 2.1 10*3/uL (ref 1.0–4.8)
MCH: 29.4 pg (ref 27.4–33.0)
MCHC: 33.4 g/dL (ref 31.6–35.5)
MCV: 88.2 fL (ref 82.0–99.0)
MONOCYTES: 9 % (ref 4–13)
MONOS ABS: 0.59 THOU/uL (ref 0.1–0.9)
MPV: 7.7 FL (ref 7.4–10.4)
NRBC'S: 0 /100{WBCs}
PLATELET COUNT: 293 THOU/uL (ref 140–450)
PMN ABS: 3.57 THOU/uL (ref 1.5–7.7)
PMN'S: 54 % (ref 40–75)
RBC: 4.44 MIL/uL (ref 3.84–5.04)
RDW: 11.8 % (ref 10.2–14.0)
WBC: 6.6 THOU/uL (ref 3.5–11.0)

## 2009-08-13 LAB — ALT (SGPT): ALT (SGPT): 20 U/L (ref 7–45)

## 2009-08-13 LAB — AST (SGOT): AST (SGOT): 20 U/L (ref 8–41)

## 2009-08-13 LAB — CREATININE: ESTIMATED GLOMERULAR FILTRATION RATE: >59 mL/min/{1.73_m2} (ref >59)

## 2009-08-13 LAB — CREATININE WITH EGFR: CREATININE: 0.84 mg/dL (ref 0.49–1.10)

## 2009-08-13 NOTE — Progress Notes (Signed)
Main ID Issue: Lindsay Ryan is a 76 year old Caucasian female under care at the Infectious Disease Clinic for a longstanding history of pulmonary Mycobacterium avium complex disease with CT findings of bronchiectasis and nodules. The patient completed a two-year course of antibacterials with azithromycin and Ethambutol. Her last antimicrobials were in May 2008. Her course was complicated by possible clarithromycin related rash and elevated AST, ALT associated with rifampin/rifabutin. In Oct 2009 she had relapsed disease clinically (cough), radiologically, and microbiologically verified. Her organism remains susceptible to clarithromycin and has an MIC to moxifloxacin of less than 0.5. She was started cautiously on Clarithromycin, followed sequentially by Ethambutol and Moxifloxacin - our plan is to treat for 18 months from mid-Oct 2009.    SUBJECTIVE:  Now back from [place].  Had called in 06/2009 with report that she had to stop Moxifloxacin due to tendonitis.  Had developed a knot on the Lt achilles tendon that was painful.  Now resolving and no longer tender.  Continues PO Clarithromycin and Ethambutol.  Has a physical scheduled in May with plans for a repeat CT Chest at that visit.    No N/V/D.  No rash.  No cough.  No vision problems.    OBJECTIVE:  Blood pressure 100/64, pulse 84, temperature 37.1 C (98.7 F), temperature source Tympanic, weight 69.4 kg (153 lb), SpO2 92.00%.  HEENT no thrush.  Chest clear.  CVS normal S1, S2  No rash.  Resolving non tender nodule on Lt achilles tendon.     ASSESSMENT/PLAN:  Lindsay Ryan is a 76 year old Caucasian female with relapsed pulmonary Mycobacterium avium complex disease with CT evidence of active infection verified by microbiological specimens with one of three sputum samples positive in July 2009, and two of three positive in August 2009 for a clarithromycin sensitive strain of Mycobacterium avium intracellulare.  Patient is tolerating her clarithromycin 500 mg b.i.d., Ethambutol 800 mg daily, having stopped moxifloxacin after more than 1 year. We plan to treat for a minimum of one and one-half years.     1.  Check labs  2.  Continue Claritro/Ethambutol  3.  DC all abs after CT Chest.  Results to be faxed to me.

## 2009-09-03 ENCOUNTER — Encounter (INDEPENDENT_AMBULATORY_CARE_PROVIDER_SITE_OTHER): Payer: Medicare Other | Admitting: Infectious Disease

## 2009-09-21 ENCOUNTER — Ambulatory Visit (INDEPENDENT_AMBULATORY_CARE_PROVIDER_SITE_OTHER): Payer: Medicare Other | Admitting: Pulmonary Disease

## 2009-09-21 MED ORDER — TIOTROPIUM BROMIDE 18 MCG CAPSULE WITH INHALATION DEVICE
18.0000 ug | ORAL_CAPSULE | Freq: Every day | RESPIRATORY_TRACT | Status: AC
Start: 2009-09-21 — End: ?

## 2009-09-21 NOTE — Progress Notes (Signed)
See dictated note.    mai  Dr. Rondall Allegra  Doing well nearly finished with second course 24 months and then 18 months if abx      Chest clear cor reg  abd soft      Current outpatient prescriptions   Medication Sig   . Clarithromycin (BIAXIN) 500 mg Tab take 1 Tab by mouth Twice daily.    Marland Kitchen ethambutol (MYAMBUTOL) 400 mg Tab take 2 Tabs by mouth Once a day.    . tiotropium bromide (SPIRIVA HANDIHALER) 18 mcg CpDv take 18 mcg by inalation Once a day.            rtc 6 months.  Jimmey Ralph

## 2009-10-08 NOTE — Progress Notes (Addendum)
Addendum:    Received copy of CT Chest dated 10/01/09.  Compared to study dated 09/21/08.    Conclusion:  Overall appearance similar/stable with several small nodular infiltrates that are new since last years study.

## 2009-10-19 ENCOUNTER — Other Ambulatory Visit (HOSPITAL_COMMUNITY): Payer: Self-pay

## 2009-11-12 ENCOUNTER — Ambulatory Visit (INDEPENDENT_AMBULATORY_CARE_PROVIDER_SITE_OTHER): Payer: Medicare Other | Admitting: Infectious Disease

## 2009-11-12 ENCOUNTER — Encounter (INDEPENDENT_AMBULATORY_CARE_PROVIDER_SITE_OTHER): Payer: Medicare Other | Admitting: Infectious Disease

## 2009-11-12 VITALS — BP 110/60 | HR 59 | Temp 98.1°F | Ht 66.0 in | Wt 140.6 lb

## 2009-11-12 NOTE — Progress Notes (Signed)
Main ID Issue:   Lindsay Ryan is a 76 year old Caucasian female under care at the Infect[facility] Disease Clinic for a history of relapsed pulmonary Mycobacterium avium complex disease with CT findings of bronchiectasis and nodules. The patient completed a two-year course of antibacterials with azithromycin and Ethambutol in May 2008. Her course was complicated by possible clarithromycin related rash and elevated AST, ALT associated with rifampin/rifabutin. In Oct 2009 she had relapsed disease clinically (cough), radiologically, and microbiologically verified. Her organism remained susceptible to clarithromycin with an MIC to moxifloxacin of less than 0.5. She was started cautiously on Clarithromycin, followed sequentially by Ethambutol and Moxifloxacin - with plans to treat for 18 months from mid-Oct 2009.  In 06/2009 she had to stop her Moxifloxacin due to tendonitis and 3 months later she stopped her other 2 drugs.  CT from 0/2011 about the same but some new nodules.    SUBJECTIVE:  Overall doing well except for a chronic non productive cough  Has lost weight deliberately - now at target weight of 140 lbs.  No significant change in exercise capactlty or ADL's.  Good appetite.    OBJECTIVE:  Blood pressure 110/60, pulse 59, temperature 36.7 C (98.1 F), temperature source Tympanic, height 1.676 m (5\' 6" ), weight 63.776 kg (140 lb 9.6 oz), SpO2 92.00%.  HEENT normal.  No clubbing.  Chest clear with no wheeze or crackles.  Skin no rash.    Outside labs 09/2009:  B/Cr 13/0.6, Alb 3.7, AST/ALT 19/19, W 7.8, Hb 13.6, Plt 273.    ASSESSMENT/PLAN:  15F, relapsed pulmonary MAC, completed 18 m treatment with 3 drug regimen 09/2009.  Concerned that the chronic cough may be indicative of persistent disease.  Will follow clinically for now.  RTC 6 months.

## 2009-11-19 ENCOUNTER — Encounter (INDEPENDENT_AMBULATORY_CARE_PROVIDER_SITE_OTHER): Payer: Medicare Other | Admitting: Infectious Disease

## 2009-11-24 ENCOUNTER — Ambulatory Visit
Admission: RE | Admit: 2009-11-24 | Discharge: 2009-11-24 | Disposition: A | Discharge status: Home / Self Care | Payer: Medicare Other

## 2009-11-24 DIAGNOSIS — Z1231 Encounter for screening mammogram for malignant neoplasm of breast: Secondary | ICD-10-CM | POA: Insufficient documentation

## 2009-11-24 DIAGNOSIS — Z803 Family history of malignant neoplasm of breast: Secondary | ICD-10-CM | POA: Insufficient documentation

## 2010-03-25 ENCOUNTER — Encounter (INDEPENDENT_AMBULATORY_CARE_PROVIDER_SITE_OTHER): Payer: Self-pay | Admitting: Infectious Disease

## 2010-03-25 ENCOUNTER — Ambulatory Visit (INDEPENDENT_AMBULATORY_CARE_PROVIDER_SITE_OTHER): Payer: Medicare Other | Admitting: Infectious Disease

## 2010-03-25 VITALS — BP 150/78 | HR 69 | Temp 97.2°F | Ht 66.0 in | Wt 146.0 lb

## 2010-03-25 NOTE — Progress Notes (Signed)
Subjective:  Lindsay Ryan is a 76 year old female with history of relapsed pulmonary Mycobacterium avium complex with CT findings of bronchiectasis and nodules. Her most recent treatment regimen included moxifloxacin, ethambutol, and clarithromycin. In 06/2009 she stopped moxifloxacin due to tendonitis. In 09/2009 she stopped ethambutol and clarithromycin as she had completed her treatment course. Since her last visit in 11/2009 she reports that she has been doing well. She reports having a common cold approximately 5 weeks ago. At that time she was experiencing malaise, a subjective low grade fever, and cough productive of yellow sputum. Symptoms lasted for a few days only. She reports continued chronic cough since being diagnosed with MAC and feels as though she might be coughing more frequently since her acute illness 5 weeks ago. Her current cough is only productive in the mornings. Sputum amount is approximately two teaspoons full. Sputum appearance is mainly clear however occasionally contains a few flecks of yellow sputum but no hemoptysis. Her weight has been stable at approximately 140-145 pounds. She denies any recent fever or chills. She experiences chronic shortness of breath with activity only since being diagnosed with MAC. It is unchanged in intensity or frequency.     Objective:  BP 150/78   Pulse 69   Temp(Src) 36.2 C (97.2 F) (Tympanic)   Ht 1.676 m (5\' 6" )   Wt 66.225 kg (146 lb)   BMI 23.56 kg/m2   SpO2 94%  Repeat blood pressure: 136/75    Physical Exam:  General: appears in good health, no acute distress  HEENT: pupils equal and round, conjunctiva clear, no lymphadenopathy  Cardiovascular: regular rate and rhythm; no murmur, rubs or gallops  Lungs: clear to auscultation bilaterally  Abdomen: bowel sounds normal, soft, nontender, nondistended  Extremities: no cyanosis or edema  Skin: no rashes    Assessment/Plan:   76 year old female with history of relapsed pulmonary Mycobacterium avium complex. Most recent treatment included moxifloxacin which was stopped in 06/2009 due to tendonitis as well as ethambutol and clarithromycin which were stopped in 09/2009 as she had completed her treatment course.   Discussed with patient option of collecting sputum samples as a test for cure. Decided not to collect sputum at this time and only test again if patient experiences worsening of symptoms.    Discussed with patient risk of transmitting Mycobacterium avium to her daughter who is currently undergoing chemotherapy treatments. She will be visiting her daughter next week in [place].    Returned to clinic in approximately 6 months for follow-up after her repeat CT and scheduled appointment with her primary care physician.     Cecil Cranker, MED STUDENT 03/25/2010, 3:49 PM    S/  Recuperating from a cold.  Mild cough.  Going to [place] to care for daughter with Colon cancer.    O/Blood pressure 150/78, pulse 69, temperature 36.2 C (97.2 F), temperature source Tympanic, height 1.676 m (5\' 6" ), weight 66.225 kg (146 lb), SpO2 94.00%.  Lungs clear.    A/P:  Relapsed pulmonary MAC, now off 18 months of 3 drug treatment for about 6 m and doing well.  RTC after annual visit at [facility] in May (with CT chest).    I saw and examined the patient.  I reviewed the student's note.  I agree with the findings and plan of care as documented in the note.  Any exceptions/additions are edited/noted.    Arif R. Madolyn Frieze, MD 03/25/2010, 4:40 PM

## 2010-05-24 ENCOUNTER — Encounter (INDEPENDENT_AMBULATORY_CARE_PROVIDER_SITE_OTHER): Payer: Medicare Other | Admitting: Pulmonary Disease

## 2010-08-09 ENCOUNTER — Encounter (INDEPENDENT_AMBULATORY_CARE_PROVIDER_SITE_OTHER): Payer: Medicare Other | Admitting: Pulmonary Disease

## 2010-08-30 ENCOUNTER — Encounter (INDEPENDENT_AMBULATORY_CARE_PROVIDER_SITE_OTHER): Payer: Self-pay | Admitting: Pulmonary Disease

## 2010-08-30 ENCOUNTER — Ambulatory Visit: Payer: Medicare Other | Attending: Pulmonary Disease | Admitting: Pulmonary Disease

## 2010-08-30 DIAGNOSIS — C4490 Unspecified malignant neoplasm of skin, unspecified: Secondary | ICD-10-CM | POA: Insufficient documentation

## 2010-08-30 DIAGNOSIS — E7800 Pure hypercholesterolemia, unspecified: Secondary | ICD-10-CM | POA: Insufficient documentation

## 2010-08-30 DIAGNOSIS — E785 Hyperlipidemia, unspecified: Secondary | ICD-10-CM | POA: Insufficient documentation

## 2010-08-30 DIAGNOSIS — A318 Other mycobacterial infections: Secondary | ICD-10-CM | POA: Insufficient documentation

## 2010-08-30 DIAGNOSIS — J45909 Unspecified asthma, uncomplicated: Secondary | ICD-10-CM | POA: Insufficient documentation

## 2010-08-30 NOTE — Progress Notes (Signed)
Cough better    Doing well now off MAI treatment for 1 year now    Weight good appetite    Chest clear cor reg      Current outpatient prescriptions   Medication Sig   . simvastatin (ZOCOR) 5 mg Oral Tablet take 10 mg by mouth QPM.   . CALCIUM CARBONATE/VITAMIN D2 (CALCIUM + VITAMIN D ORAL) take  by mouth.   . tiotropium bromide (SPIRIVA HANDIHALER) 18 mcg CpDv take 1 Cap by inhalation Once a day.   Marland Kitchen DISCONTD: Clarithromycin (BIAXIN) 500 mg Tab take 1 Tab by mouth Twice daily.    Marland Kitchen DISCONTD: ethambutol (MYAMBUTOL) 400 mg Tab take 2 Tabs by mouth Once a day.            1. MAIC (mycobacterium avium-intracellulare complex) (031.8H)    2. Asthma (493.10)    3. Hypercholesterolemia (272.0)    4. Hyperlipidemia (272.4)    5. Skin cancer (173.90A)            rtc 7 months    refills    Jimmey Ralph

## 2010-10-12 NOTE — Letter (Signed)
[facility] Department of Medicine  [address]  [place], [place] [address]      INTERNAL PROVIDER LETTER    PATIENT NAME: Lindsay Ryan, Lindsay Ryan [facility]  CHART NUMBER: [id number]  DATE OF BIRTH: October 19, 1933  DATE OF SERVICE: 03/18/2009    March 18, 2009    To:  Merrily Brittle, MD    Dear Dr. Madolyn Frieze:    Following your referral, Canaan Prue was seen again in the Pulmonary Clinic at [facility].  As you recall, she has MAI, now being treated with her second go around of antibiotics.  She also has asthma.  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  Her cough is currently tolerable, possibly improved and her weight is stable.    On examination, her blood pressure is 120/70.  Her oxygen saturation is 95%.  Her respiratory rate is 20.  Her pulse is 72.  She is 5 feet 6 inches and weighs 157 pounds.    She has no inspiratory crackles or expiratory wheezes.  Her cardiac exam is unremarkable.  Her abdomen is soft and she has no peripheral edema.    My assessment is MAI infection being treated with three oral antibiotics.    She also has mild asthma treated primarily with Spiriva.  She is planning on wintering in [place].    I will see her back in about six months.  She is very appreciative of the care she gets from your skillful attention, Dr. Madolyn Frieze.    Sincerely,      Drexel Iha, MD  Professor &amp; Chief, Section of Pulmonary and Critical Care  [place] of Medicine    [id number]; D: 03/18/2009 15:37:14; T: 03/18/2009 19:54:12    cc: Merrily Brittle MD      Shirleen Schirmer

## 2010-10-12 NOTE — Letter (Signed)
[facility] Department of Medicine  [address]  [place], [place] [address]      INTERNAL PROVIDER LETTER    PATIENT NAME: Lindsay Ryan, Lindsay Ryan [facility]  CHART NUMBER: [id number]  DATE OF BIRTH: Feb 20, 1934  DATE OF SERVICE: 09/21/2009    Sep 21, 2009     Dear Dr. Madolyn Frieze:    Danica Camarena was seen in the Pulmonary Clinic.  She is on her eighteenth month of a second course of therapy for her MAI.  I am optimistic when this stops, she will not have radiographic changes or new symptoms of cough.  She spent several months in [place] and is bright-eyed and bushytailed after returning from there.      She has no inspiratory crackles or expiratory wheezes.  Her blood pressure is 110/68.  Her oxygen saturation is 94 and her pulse is 81.  She is 5 feet 6 inches and weighs 149 pounds.    We will see her back in clinic in about six months.  She will have the CT scan as arranged by infectious disease service.  I gave her Spiriva, an unusual inhaled therapy, for asthma.    Sincerely,      Drexel Iha, MD  Professor & Chief, Section of Pulmonary and Critical Care  [place] of Medicine    [id number]; D: 09/21/2009 15:05:34; T: 09/21/2009 22:24:33    cc: Merrily Brittle MD      Shirleen Schirmer

## 2010-10-28 ENCOUNTER — Ambulatory Visit: Payer: Medicare Other | Attending: Infectious Disease | Admitting: Infectious Disease

## 2010-10-28 VITALS — BP 112/64 | HR 72 | Temp 97.9°F | Wt 142.3 lb

## 2010-10-28 DIAGNOSIS — A318 Other mycobacterial infections: Secondary | ICD-10-CM | POA: Insufficient documentation

## 2010-10-28 NOTE — Progress Notes (Signed)
Main ID Issue:   Lindsay Ryan is a 77 year old Caucasian female under care at the Infectious Disease Clinic for a history of relapsed pulmonary Mycobacterium avium complex disease with CT findings of bronchiectasis and nodules. The patient completed a two-year course of antibacterials with azithromycin and Ethambutol in May 2008. Her course was complicated by possible clarithromycin related rash and elevated AST, ALT associated with rifampin/rifabutin. In Oct 2009 she had relapsed disease clinically (cough), radiologically, and microbiologically verified. Her organism remained susceptible to clarithromycin with an MIC to moxifloxacin of less than 0.5. She was started cautiously on Clarithromycin, followed sequentially by Ethambutol and Moxifloxacin - with plans to treat for 18 months from mid-Oct 2009. In 06/2009 she had to stop her Moxifloxacin due to tendonitis and 3 months later she stopped her other 2 drugs. CT from 09/2010 with lower lung changes of MAC.  7mm Lt UL nodule.    S/ Recuperating from Lt eye cataract surgery. Mild cough persists - AM clear sputum. Back from [place] 08/2010.  Daughter with Colon cancer doing well and responding to chemotherapy.     O/Blood pressure 112/64, pulse 72, temperature 36.6 C (97.9 F), temperature source Tympanic, weight 64.547 kg (142 lb 4.8 oz), SpO2 96.00%.  No clubbing.  Lungs clear.     A/P: Relapsed pulmonary MAC, now off an 18 month course of 3 drug treatment from 09/2009 and doing well. RTC annually.  Will check cultures only if new symptoms.

## 2010-12-08 ENCOUNTER — Other Ambulatory Visit (HOSPITAL_COMMUNITY): Payer: Self-pay

## 2010-12-08 ENCOUNTER — Ambulatory Visit
Admission: RE | Admit: 2010-12-08 | Discharge: 2010-12-08 | Disposition: A | Discharge status: Home / Self Care | Payer: Medicare Other | Source: Ambulatory Visit

## 2010-12-08 DIAGNOSIS — Z803 Family history of malignant neoplasm of breast: Secondary | ICD-10-CM | POA: Insufficient documentation

## 2010-12-08 DIAGNOSIS — Z859 Personal history of malignant neoplasm, unspecified: Secondary | ICD-10-CM | POA: Insufficient documentation

## 2010-12-08 DIAGNOSIS — Z1231 Encounter for screening mammogram for malignant neoplasm of breast: Secondary | ICD-10-CM | POA: Insufficient documentation

## 2011-04-04 ENCOUNTER — Encounter (INDEPENDENT_AMBULATORY_CARE_PROVIDER_SITE_OTHER): Payer: Self-pay | Admitting: Pulmonary Disease

## 2011-04-04 ENCOUNTER — Ambulatory Visit: Payer: Medicare Other | Attending: Pulmonary Disease | Admitting: Pulmonary Disease

## 2011-04-04 DIAGNOSIS — Z23 Encounter for immunization: Secondary | ICD-10-CM | POA: Insufficient documentation

## 2011-04-04 DIAGNOSIS — E785 Hyperlipidemia, unspecified: Secondary | ICD-10-CM | POA: Insufficient documentation

## 2011-04-04 DIAGNOSIS — A318 Other mycobacterial infections: Secondary | ICD-10-CM | POA: Insufficient documentation

## 2011-04-04 DIAGNOSIS — E7800 Pure hypercholesterolemia, unspecified: Secondary | ICD-10-CM | POA: Insufficient documentation

## 2011-04-04 DIAGNOSIS — J45909 Unspecified asthma, uncomplicated: Secondary | ICD-10-CM | POA: Insufficient documentation

## 2011-04-04 DIAGNOSIS — C4490 Unspecified malignant neoplasm of skin, unspecified: Secondary | ICD-10-CM | POA: Insufficient documentation

## 2011-04-04 NOTE — Progress Notes (Signed)
No weight loss or fever    Cough has returned    Chest clear cor reg    Current Outpatient Prescriptions   Medication Sig   . Cholecalciferol, Vitamin D3, (VITAMIN D-3) 2,000 unit Oral Capsule take  by mouth.     . simvastatin (ZOCOR) 5 mg Oral Tablet take 10 mg by mouth QPM.   . CALCIUM CARBONATE/VITAMIN D2 (CALCIUM + VITAMIN D ORAL) take  by mouth.   . tiotropium bromide (SPIRIVA HANDIHALER) 18 mcg CpDv take 1 Cap by inhalation Once a day.     1. Asthma (493.10)    2. Hyperlipidemia (272.4)    3. MAIC (mycobacterium avium-intracellulare complex) (031.8)    4. Hypercholesterolemia (272.0)    5. Skin cancer (173.90)        Collect sputums AM before heading off to [place]    No CT ordered today    Jimmey Ralph

## 2011-04-11 ENCOUNTER — Ambulatory Visit
Admission: RE | Admit: 2011-04-11 | Discharge: 2011-04-11 | Disposition: A | Discharge status: Home / Self Care | Payer: Medicare Other | Source: Ambulatory Visit | Attending: Pulmonary Disease | Admitting: Pulmonary Disease

## 2011-04-11 DIAGNOSIS — A310 Pulmonary mycobacterial infection: Secondary | ICD-10-CM | POA: Insufficient documentation

## 2011-05-12 LAB — AFB CULTURE WITH STAIN: ACID FAST SMEAR: NONE SEEN

## 2011-08-02 LAB — REFERENCE SPECIMEN

## 2011-08-10 ENCOUNTER — Encounter (INDEPENDENT_AMBULATORY_CARE_PROVIDER_SITE_OTHER): Payer: Medicare Other | Admitting: Pulmonary Disease

## 2011-10-28 ENCOUNTER — Encounter (INDEPENDENT_AMBULATORY_CARE_PROVIDER_SITE_OTHER): Payer: Self-pay | Admitting: Infectious Disease

## 2011-10-28 ENCOUNTER — Ambulatory Visit: Payer: Medicare Other | Attending: Infectious Disease | Admitting: Infectious Disease

## 2011-10-28 VITALS — BP 130/75 | HR 62 | Temp 97.7°F | Ht 65.39 in | Wt 143.5 lb

## 2011-10-28 DIAGNOSIS — A318 Other mycobacterial infections: Secondary | ICD-10-CM | POA: Insufficient documentation

## 2011-10-28 NOTE — Progress Notes (Signed)
Main ID Issue:   Lindsay Ryan is a 78 year old Caucasian female with relapsed pulmonary Mycobacterium avium complex disease with CT findings of bronchiectasis and nodules. The patient completed a two-year course of antibacterials with azithromycin and Ethambutol in May 2008. Her course was complicated by possible clarithromycin related rash and elevated AST, ALT associated with rifampin/rifabutin. In Oct 2009 she had relapsed disease clinically (cough), radiologically, and microbiologically verified. Her organism remained susceptible to clarithromycin with an MIC to moxifloxacin of less than 0.5. She was started cautiously on Clarithromycin, followed sequentially by Ethambutol and Moxifloxacin - with plans to treat for 18 months from mid-Oct 2009. In 06/2009 she had to stop her Moxifloxacin due to tendonitis and 3 months later she stopped her other 2 drugs.  Subsequently followed by annual visits and CT chest.  04/2011 sputum still positive for a macrolide sensitive MAC strain.    CT from 09/2010 with lower lung changes of MAC. 7mm Lt UL nodule.  CT from 09/2011 with grossly stable exam.  LUL nodule minimally smaller.    S/ Doing well overall with no hospitalizations in interim.  Mild cough persists - AM clear sputum. Back from [place] but now planning to mave there permanently in 01/3012 to be closer to daughter with Colon cancer.     O/Blood pressure 130/75, pulse 62, temperature 36.5 C (97.7 F), temperature source Tympanic, height 1.661 m (5' 5.39"), weight 65.1 kg (143 lb 8.3 oz), SpO2 95.00%.  No clubbing  Lungs clear except for occasional ronchi.     A/P: Relapsed pulmonary MAC, now off treatment from 09/2009 and doing well clinically. RTC annually. Will check cultures only if new symptoms.  Script given for Incentive spirometer to be used BID.

## 2011-11-02 ENCOUNTER — Encounter (INDEPENDENT_AMBULATORY_CARE_PROVIDER_SITE_OTHER): Payer: Medicare Other | Admitting: Pulmonary Disease

## 2011-12-16 ENCOUNTER — Other Ambulatory Visit (HOSPITAL_COMMUNITY): Payer: Self-pay

## 2011-12-19 ENCOUNTER — Ambulatory Visit
Admission: RE | Admit: 2011-12-19 | Discharge: 2011-12-19 | Disposition: A | Discharge status: Home / Self Care | Payer: Medicare Other | Source: Ambulatory Visit

## 2011-12-19 DIAGNOSIS — Z1231 Encounter for screening mammogram for malignant neoplasm of breast: Secondary | ICD-10-CM | POA: Insufficient documentation

## 2011-12-19 DIAGNOSIS — Z9189 Other specified personal risk factors, not elsewhere classified: Secondary | ICD-10-CM | POA: Insufficient documentation

## 2013-02-20 ENCOUNTER — Encounter (HOSPITAL_COMMUNITY): Payer: Self-pay

## 2014-02-07 ENCOUNTER — Other Ambulatory Visit: Payer: Self-pay

## 2015-02-14 ENCOUNTER — Other Ambulatory Visit: Payer: Self-pay

## 2016-02-20 ENCOUNTER — Other Ambulatory Visit: Payer: Self-pay

## 2017-01-15 ENCOUNTER — Emergency Department (HOSPITAL_COMMUNITY)
Admission: RE | Admit: 2017-01-15 | Discharge: 2017-01-15 | Disposition: A | Discharge status: Home / Self Care | Payer: Medicare Other | Source: Ambulatory Visit

## 2017-01-15 ENCOUNTER — Inpatient Hospital Stay
Admission: EM | Admit: 2017-01-15 | Discharge: 2017-01-15 | Disposition: A | Discharge status: Home / Self Care | Payer: Medicare Other | Attending: Physician Assistant | Admitting: Physician Assistant

## 2017-01-15 ENCOUNTER — Ambulatory Visit (HOSPITAL_COMMUNITY): Payer: Medicare Other | Admitting: Emergency Medicine

## 2017-01-15 DIAGNOSIS — Z87891 Personal history of nicotine dependence: Secondary | ICD-10-CM | POA: Insufficient documentation

## 2017-01-15 DIAGNOSIS — S46911A Strain of unspecified muscle, fascia and tendon at shoulder and upper arm level, right arm, initial encounter: Secondary | ICD-10-CM | POA: Insufficient documentation

## 2017-01-15 DIAGNOSIS — W010XXA Fall on same level from slipping, tripping and stumbling without subsequent striking against object, initial encounter: Secondary | ICD-10-CM | POA: Insufficient documentation

## 2017-01-15 NOTE — ED Nurses Note (Signed)
Written and verbal discharge instructions provided, pt voiced understanding. Education given as applicable

## 2017-01-15 NOTE — ED Triage Notes (Signed)
Mechanical fall at [employer] 30 min PTA

## 2017-01-15 NOTE — ED Provider Notes (Signed)
[facility]  DEPARTMENT OF EMERGENCY MEDICINE  EMERGENCY DEPARTMENT HISTORY AND PHYSICAL      Chief Complaint:    Patient is a 83 y.o.  female [facility] with chief complaint of right shoulder pain.     History of Present Illness:    Patient complaining of right shoulder pain.  Reports slipped and fell from standing position at [employer].  Reports landed on right shoulder.  Denies any other injuries.  Denies any head injury or LOC.  Patient denies any numbness or tingling.      Review of Systems:    Constitutional: No fever, chills  Skin: No rashes or lesions  MSK: + right shoulder joint pain.  No neck or back pain  Neuro: No numbness, tingling, or weakness.  Psych: No SI or HI. Normal mood  All other symptoms reviewed and are negative, unless commented on in the HPI.     Past Medical History:  No past medical history on file.  No past surgical history on file.    Above history reviewed with patient.  Allergies, medication list, and old records also reviewed.     Social History:    Social History   Substance Use Topics   . Smoking status: Former Games developermoker   . Smokeless tobacco: Not on file   . Alcohol use No       Vitals:    Filed Vitals:    01/15/17 1421   BP: (!) 146/58   Pulse: 65   Resp: 16   Temp: 36.6 C (97.8 F)   SpO2: 94%       Physical Exam:     Nursing note and vitals reviewed.  Vital signs reviewed as above. No acute distress.   Constitutional: Pt is well-developed and well-nourished.   Head: Normocephalic and atraumatic.   Neck: Soft, supple, full range of motion.   Pulmonary/Chest: No respiratory distress.  Regular rate and rhythm.  Equal breath sounds bilaterally without wheeze, rales or rhonchi.  Extremities:  Right anterior lateral shoulder tenderness.  Musculoskeletal: Normal range of motion. No deformities.    Neurological: CNs 2-12 grossly intact.  No focal deficits noted.  Skin: No rash or lesions  Psychiatric: Patient has a normal mood and affect.       Labs:  No results found for  any visits on 01/15/17.    Imaging:         Orders Placed This Encounter   . XR SHOULDER RIGHT           MDM:     X-rays reviewed by myself.  Radiology report discussed with patient and husband at bedside.  Patient placed in sling.  Patient was given the opportunity to ask any questions prior to discharge.  Patient was involved in medical decision making and is agreeable to treatment/discharge plan.    Impression:     Right shoulder strain    Disposition/Plan:    Patient discharged home stable condition with no prescriptions.      This note was partially generated using MModal Fluency Direct system, and there may be some incorrect words, spellings, and punctuation that were not noted in checking the note before saving.      [employer] GroupCathy Renee Trinidad Petron, PA-C

## 2017-01-15 NOTE — ED Nurses Note (Signed)
Arm sling supplied and fitted to right arm at this time.

## 2017-02-25 ENCOUNTER — Other Ambulatory Visit: Payer: Self-pay

## 2018-08-13 IMAGING — CT CT CHEST WITHOUT CONTRAST
2 of 3 series · 15 of 36 positions shown, 18 images · non-contrast
Comparison: Comparison was made to the prior exam(s) within the last 12 months 
dated  07/17/2018 Vej[address]bcke CT

CT CHEST WITHOUT CONTRAST, 08/13/2018 [DATE]: 
CLINICAL INDICATION: Influenza A and pneumonia. History of mycobacterial 
infection. COPD, bronchiectasis, shortness of breath on exertion, productive 
cough. 
A search for DICOM formatted images was conducted for prior CT imaging studies 
completed at a non-affiliated media free facility.
TECHNIQUE: The chest was scanned from base of neck through the lung bases 
without contrast on a high resolution low dose CT scanner. Routine MPR and MIP 
3D renderings were reconstructed on an independent workstation with concurrent 
physician supervision.

[Series 2: chest 2.0 i31s 3 · axial · 0.65mm/px · z∈[-314,-40]mm · 12 of 161 slices shown, 15 images]
[im 12/161  mediastinal]
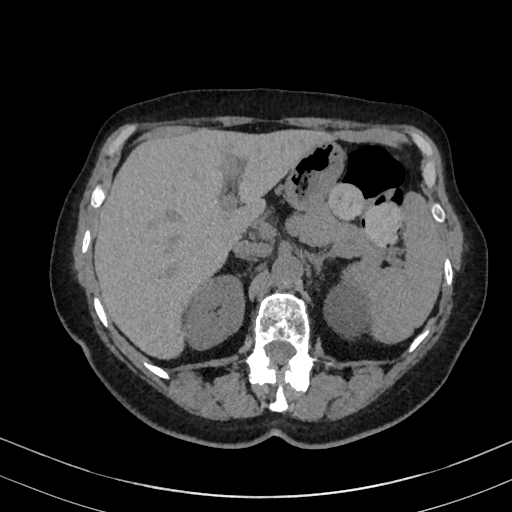
[im 12/161  lung]
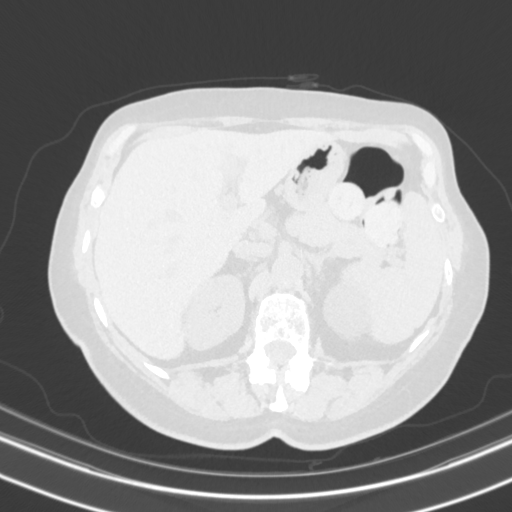
[im 24/161  lung]
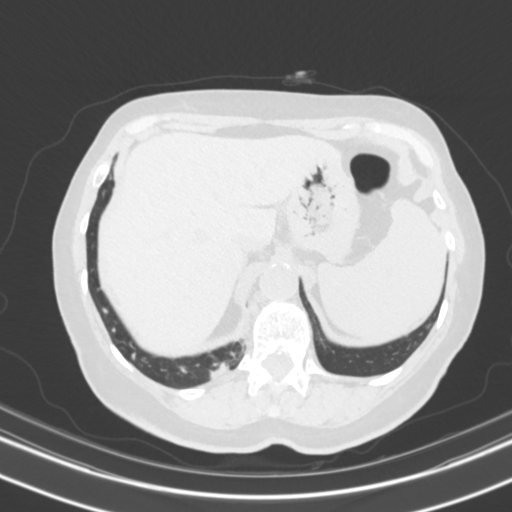
[im 36/161  lung]
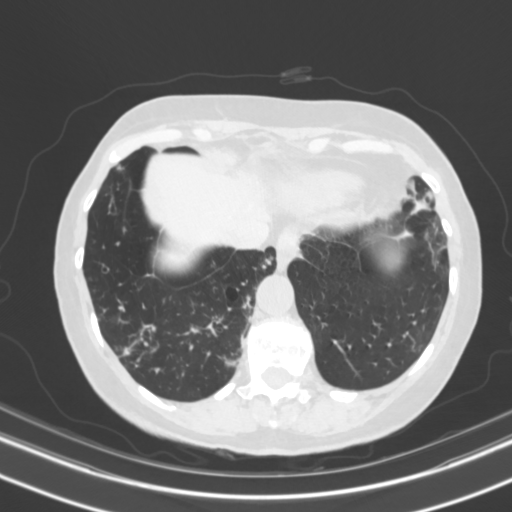
[im 48/161  lung]
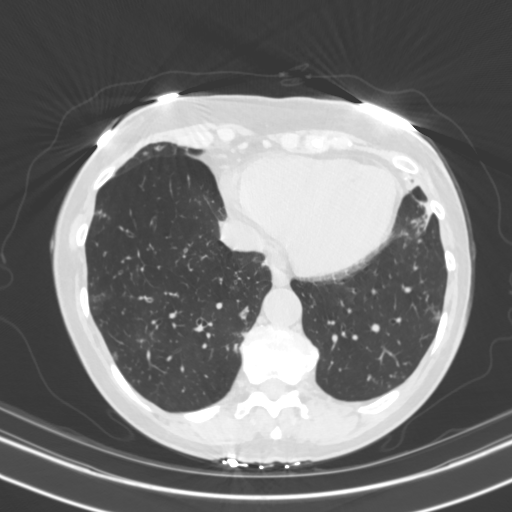
[im 60/161  mediastinal]
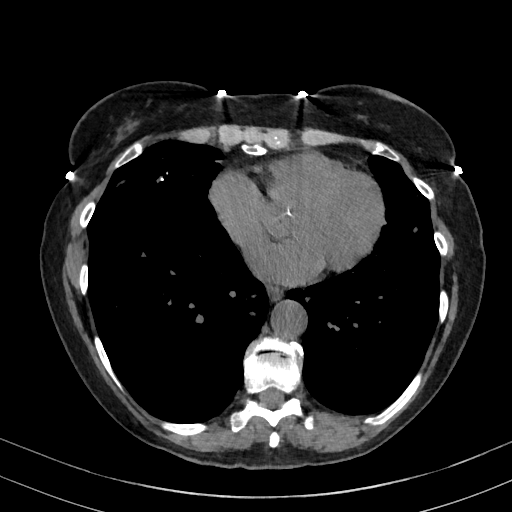
[im 60/161  lung]
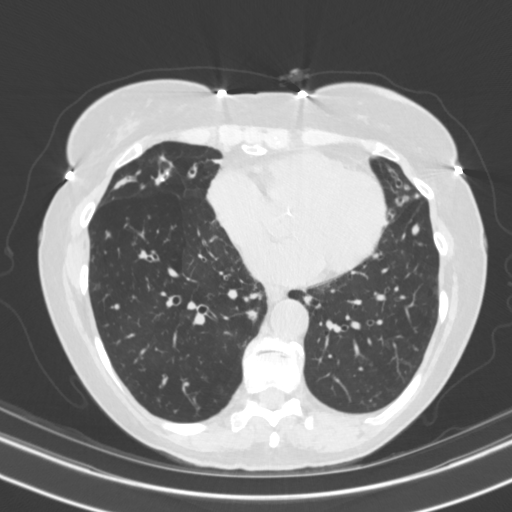
[im 72/161  lung]
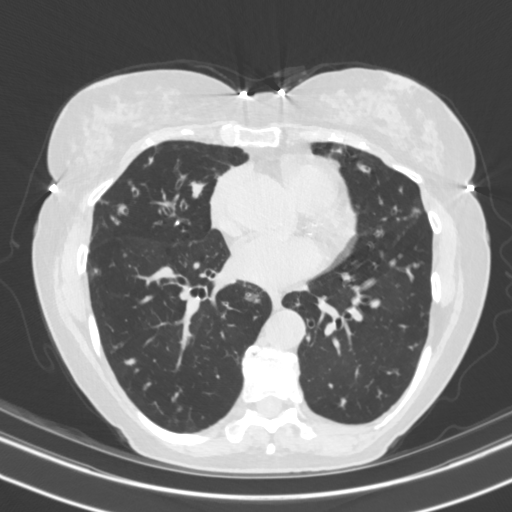
[im 89/161  lung]
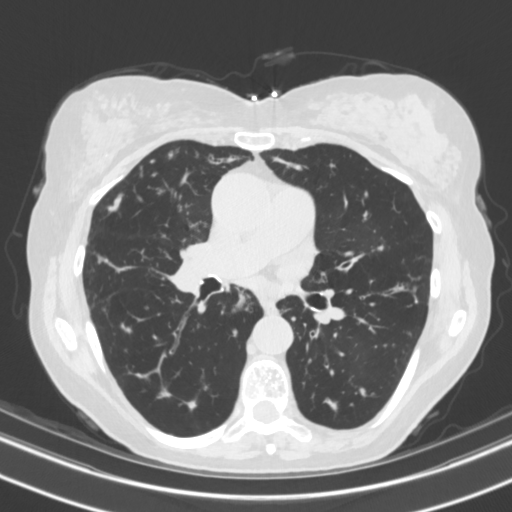
[im 101/161  lung]
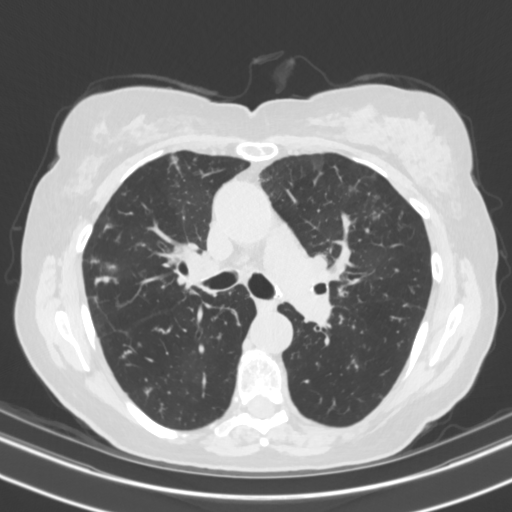
[im 113/161  mediastinal]
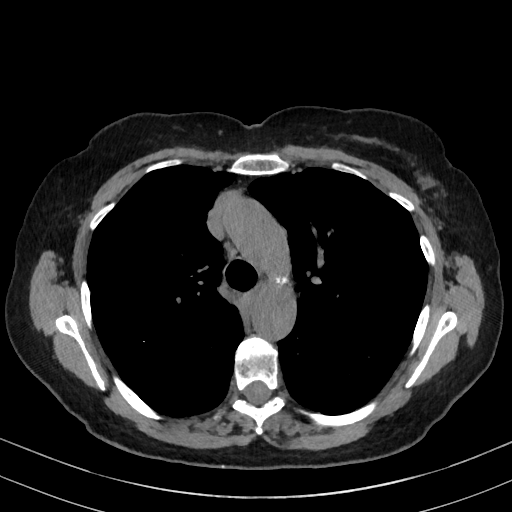
[im 113/161  lung]
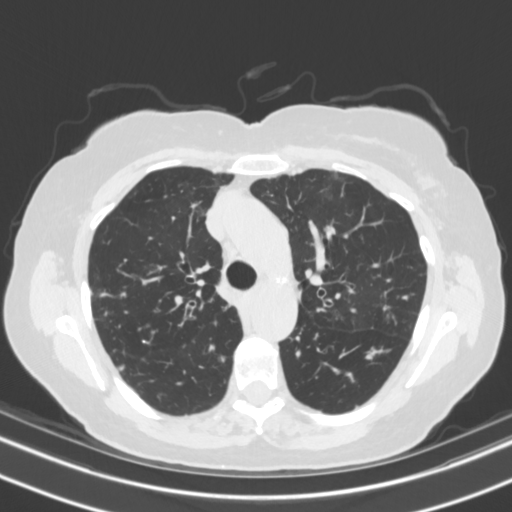
[im 125/161  lung]
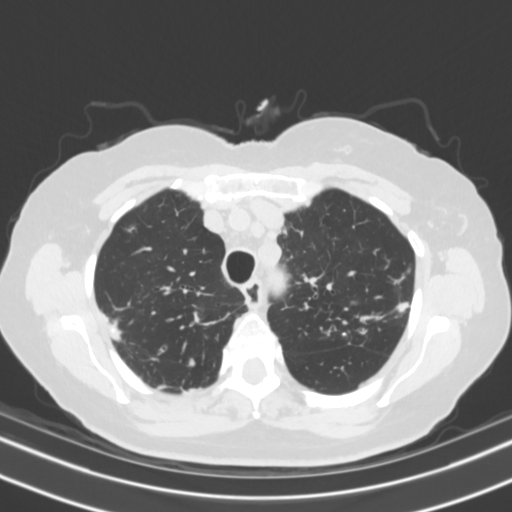
[im 137/161  lung]
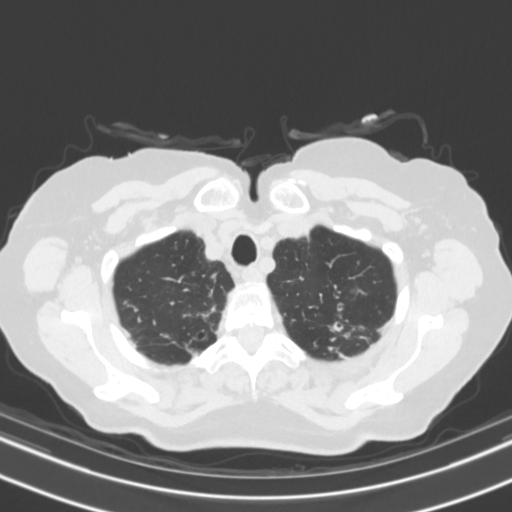
[im 149/161  lung]
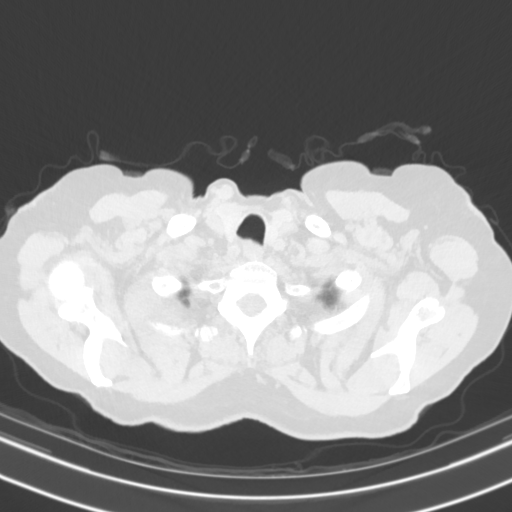

[Series 5: coronal · coronal · 0.62mm/px · 3 of 112 slices shown]
[im 23/112  lung]
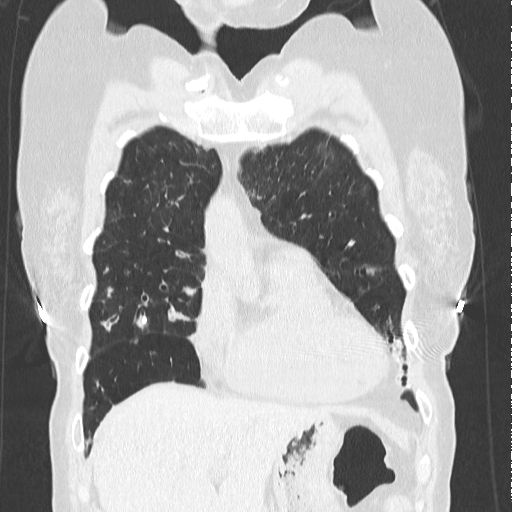
[im 45/112  lung]
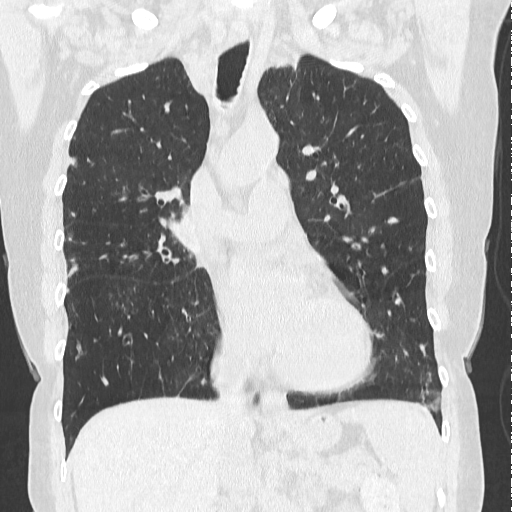
[im 67/112  lung]
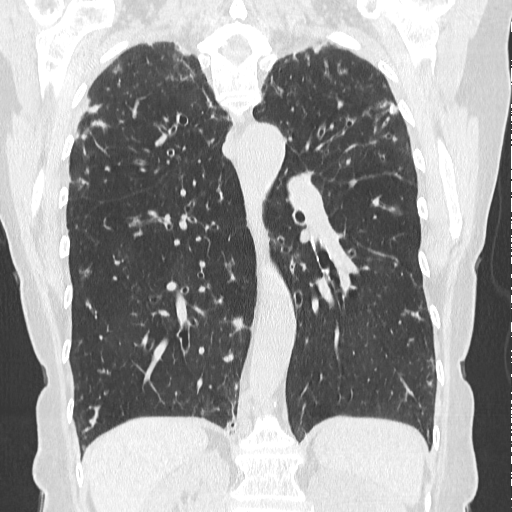

[15 of 36 positions shown; findings below may reference images not displayed]

FINDINGS: LUNGS: No overall change in the scattered bilateral small noncalcified nodules 
and reticulonodular opacities with peripheral predominance. Stable small focus 
of consolidation in the ENOCK K. Stable bilateral tubular bronchiectasis, bronchial 
wall thickening and mucus plugging, most marked in the RML and lingula. Several 
small scattered groundglass opacities. No pleural effusion. 
HEART: Normal in size. Coronary arterial calcifications. 
LYMPH NODES: No adenopathy. 
MEDIASTINUM: No mass. 
AORTA/VESSELS: No aneurysm. 
ABDOMEN: 7.3 cm left upper pole renal cyst. 
MUSCULOSKELETAL: Multifocal degenerative change of the spine and shoulders. No 
fracture, lytic or blastic lesion.
IMPRESSION: 1. No overall change in the scattered bilateral small noncalcified nodules, 
reticulonodular opacities, small focus of consolidation in the ENOCK K, tubular 
bronchiectasis, bronchial wall thickening and mucus plugging. Findings are 
consistent with history of atypical mycobacterium. 
2. Atherosclerosis and coronary arterial calcifications. 
3. 7.3 cm left upper pole renal cyst. 
4. Multifocal degenerative change of the spine and shoulders. 
RADIATION DOSE REDUCTION: All CT scans are performed using radiation dose 
reduction techniques, when applicable.  Technical factors are evaluated and 
adjusted to ensure appropriate moderation of exposure.  Automated dose 
management technology is applied to adjust the radiation doses to minimize 
exposure while achieving diagnostic quality images.

## 2019-01-05 ENCOUNTER — Other Ambulatory Visit: Payer: Self-pay

## 2019-12-13 ENCOUNTER — Other Ambulatory Visit: Payer: Self-pay

## 2020-03-25 IMAGING — CT CT CHEST WITHOUT CONTRAST
2 of 3 series · 14 of 36 positions shown, 17 images · non-contrast
Comparison: CT chest 08/13/2018 greater than 12 months ago

CT CHEST WITHOUT CONTRAST, 03/25/2020 [DATE]: 
CLINICAL INDICATION: Two week history of cough. Chronic shortness of breath. 
A search for DICOM formatted images was conducted for prior CT imaging studies 
completed at a non-affiliated media free facility.
TECHNIQUE: The chest was scanned from base of neck through the lung bases 
without contrast on a high resolution low dose CT scanner.  Routine MPR and MIP 
3D renderings were reconstructed on an independent workstation with concurrent 
physician supervision.

[Series 2: axial · axial · 0.57mm/px · z∈[-292,-0]mm · 11 of 170 slices shown, 14 images]
[im 16/170  mediastinal]
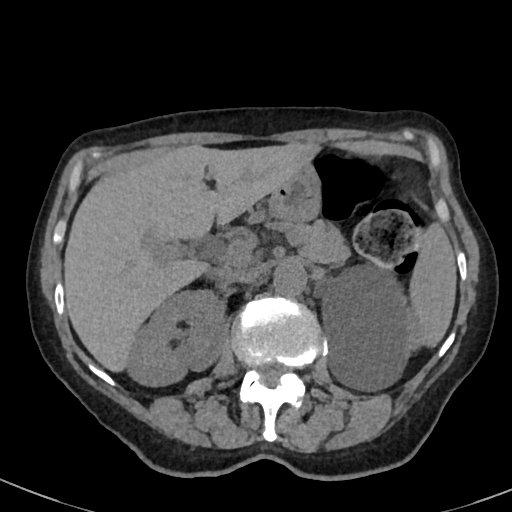
[im 16/170  lung]
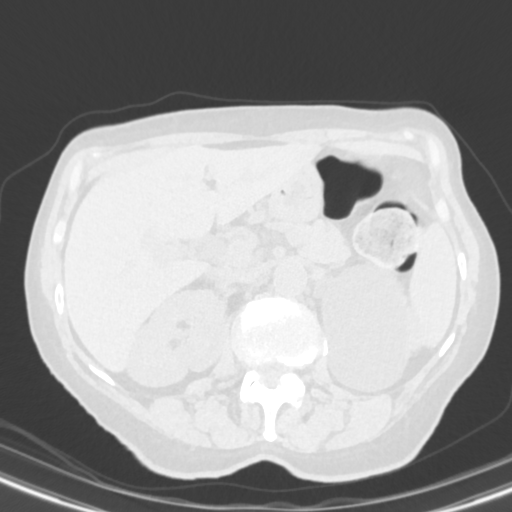
[im 31/170  lung]
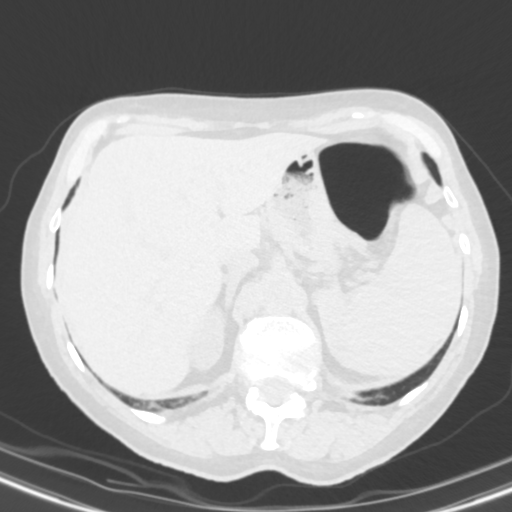
[im 47/170  lung]
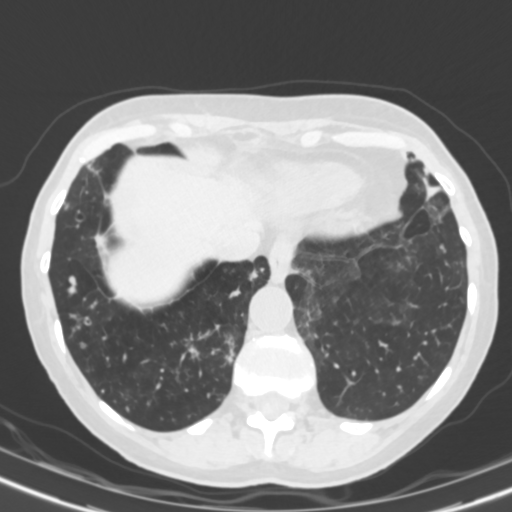
[im 62/170  lung]
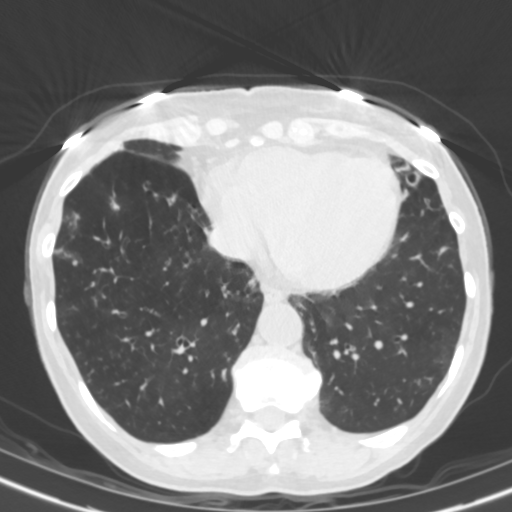
[im 77/170  mediastinal]
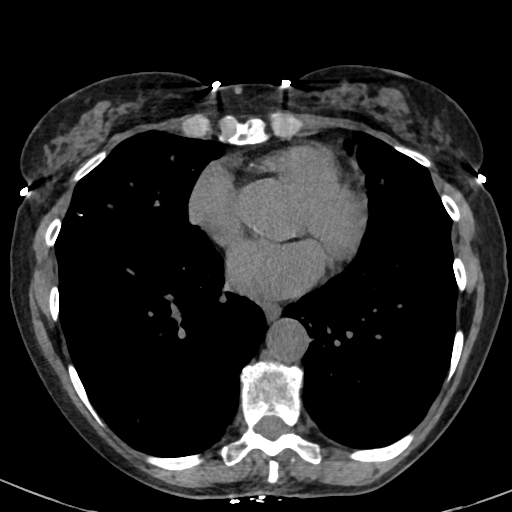
[im 77/170  lung]
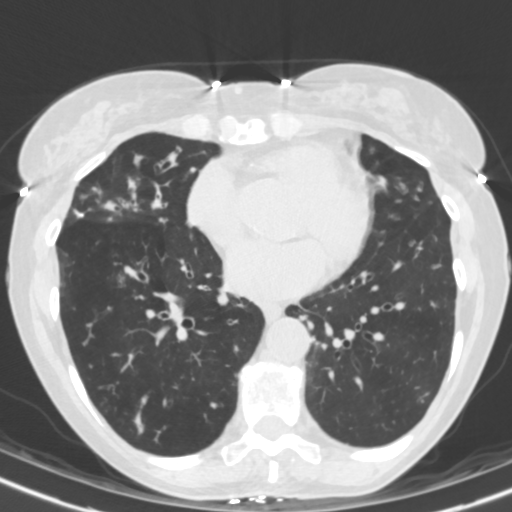
[im 85/170  lung]
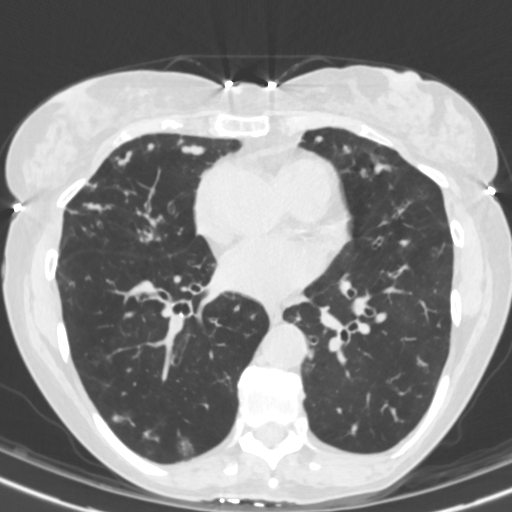
[im 100/170  lung]
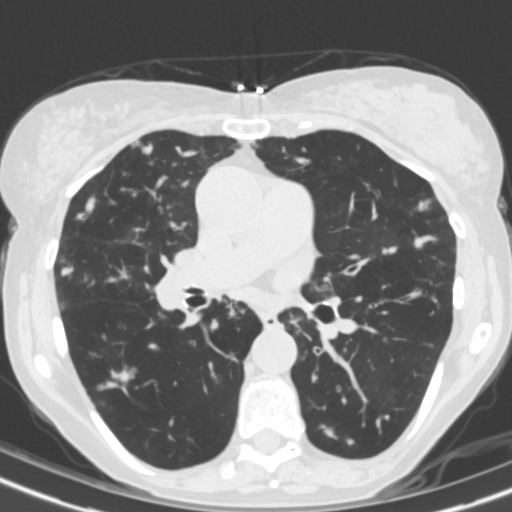
[im 116/170  lung]
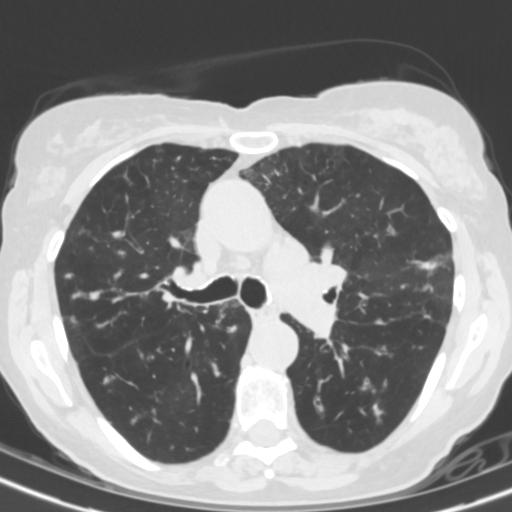
[im 131/170  mediastinal]
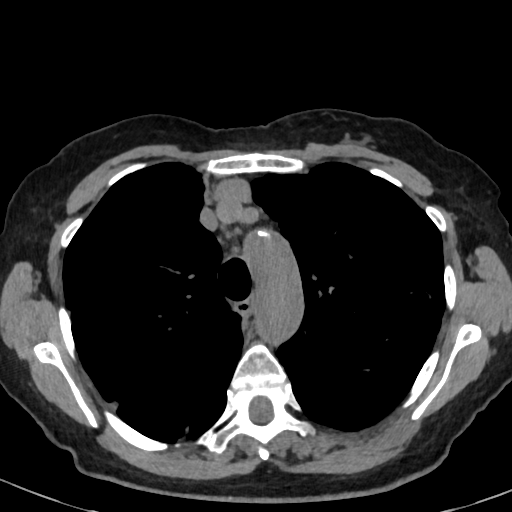
[im 131/170  lung]
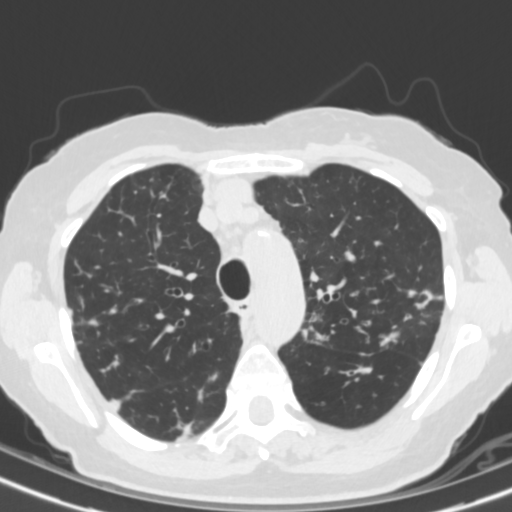
[im 146/170  lung]
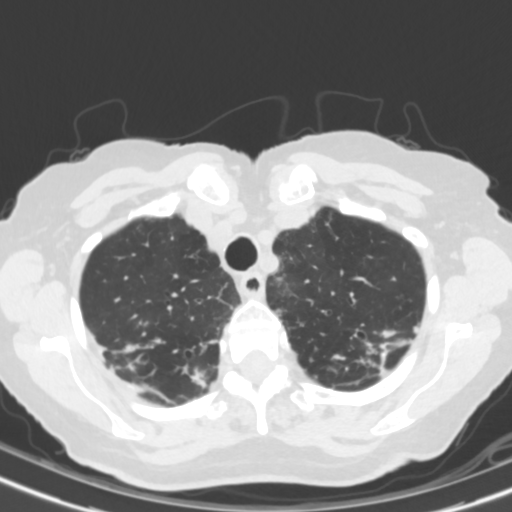
[im 162/170  lung]
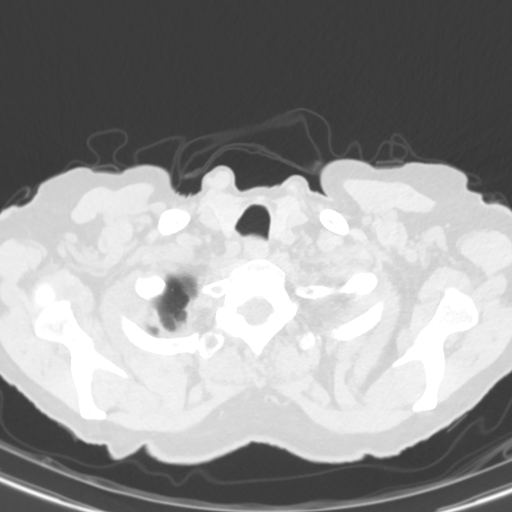

[Series 5: cor · coronal · 0.59mm/px · 3 of 113 slices shown]
[im 23/113  lung]
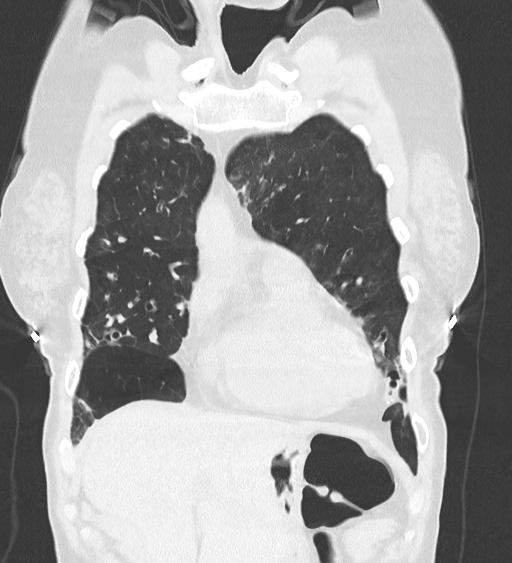
[im 45/113  lung]
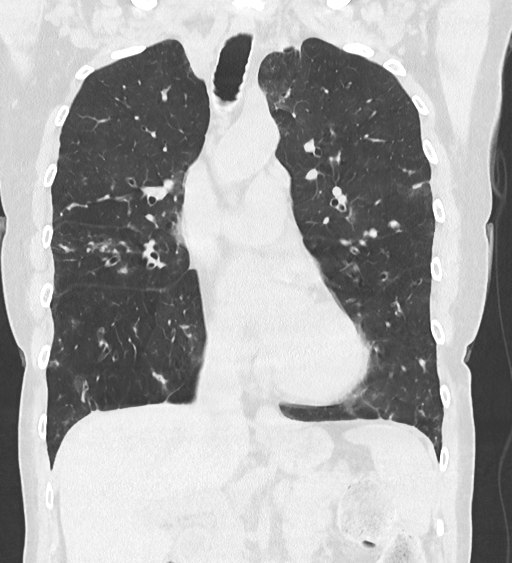
[im 68/113  lung]
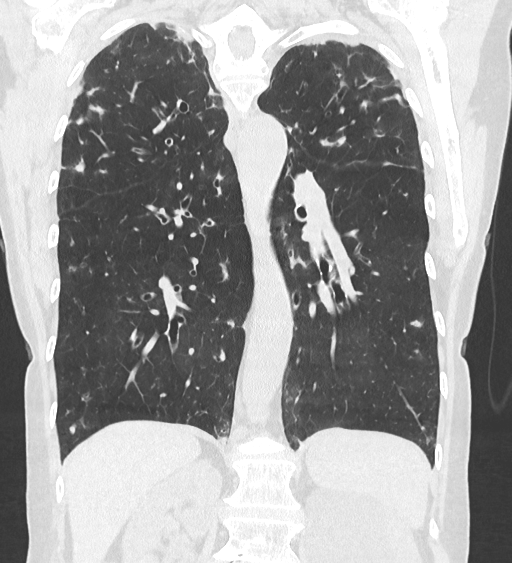

[14 of 36 positions shown; findings below may reference images not displayed]

FINDINGS: Extensive bilateral pulmonary pathology is again noted. The pathology consists 
of a constellation of pulmonary abnormalities. Patchy upper and lower lung zone 
mostly cylindrical bronchiectasis is present focally more prominent in the 
posterior upper lobe apices, RIGHT middle lobe and lingula. Abundant 
endobronchial secretions are present especially in the bronchiectatic airways. 
There are bilateral clusters of tree-in-bud opacities. Multiple bilateral 
irregularly marginated nodules are present most of which are stable however 
there are several new irregularly marginated nodule at several locations in both 
lungs. Multifocal parenchymal scarring is present bilaterally which is stable. 
There is inhomogeneous ventilation especially evident in the basilar segments of 
the RIGHT lower lobe. Scattered granulomatous calcifications are present. These 
findings taken together are suggestive of chronic active atypical mycobacterial 
infection which has shown mild interval progression since 08/13/2018. 
There is no pleural effusion. Heart is normal in size. There are minimal 
coronary artery calcifications. The thoracic aorta diameter is within normal 
limits. The main pulmonary artery is borderline at 3 cm. The esophagus appears 
normal. The subcarinal lymph node is mildly enlarged but otherwise the other 
mediastinal lymph nodes are unremarkable. This finding is stable. 
There is no axillary lymphadenopathy. No concerning breast pathology is found. 
There is an implanted subcutaneous device in the LEFT anterior chest most likely 
a heart monitor. Limited imaging of the inferior neck shows no significant 
incidental findings. Limited imaging of the upper abdomen shows a large 
partially visible LEFT renal cyst incidental degenerative changes are in the 
spine. There is a large chronic calcified disc herniation on the LEFT side at 
T6-7. Severe degenerative changes are in both shoulders.
IMPRESSION: Extensive bilateral pulmonary abnormalities as described in detail above 
suggestive of chronic active atypical mycobacterial infection showing mild 
interval progression as compared to 08/13/2018. 
RADIATION DOSE REDUCTION: All CT scans are performed using radiation dose 
reduction techniques, when applicable.  Technical factors are evaluated and 
adjusted to ensure appropriate moderation of exposure.  Automated dose 
management technology is applied to adjust the radiation doses to minimize 
exposure while achieving diagnostic quality images.

## 2020-08-19 IMAGING — CT CT CHEST WITHOUT CONTRAST
2 of 4 series · 15 of 36 positions shown, 18 images · non-contrast
Comparison: 03/25/2020, 08/13/2018

CT CHEST WITHOUT CONTRAST, 08/19/2020 [DATE]: 
CLINICAL INDICATION:  Recurrent mycobacterial infection, could not tolerate 
antibiotics, remote history of smoking 
A search for DICOM formatted images was conducted for prior CT imaging studies 
completed at a non-affiliated media free facility.
TECHNIQUE: The chest was scanned from base of neck through the lung bases 
without contrast on a high resolution low dose CT scanner. Routine MPR and MIP 
3D renderings were reconstructed on an independent workstation with concurrent 
physician supervision.

[Series 2: chest 2.0 i31s 3 · axial · 0.63mm/px · z∈[-346,-40]mm · 12 of 169 slices shown, 15 images]
[im 8/169  mediastinal]
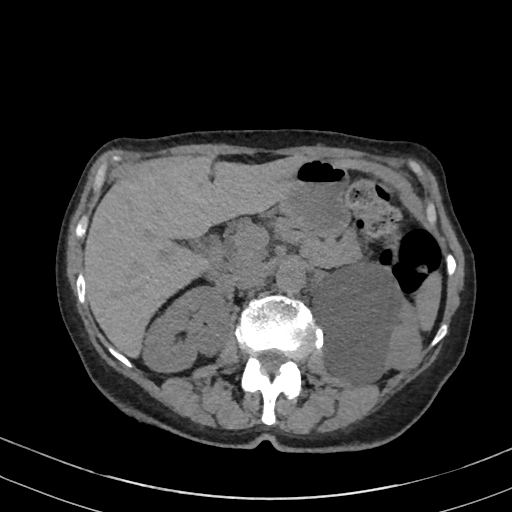
[im 8/169  lung]
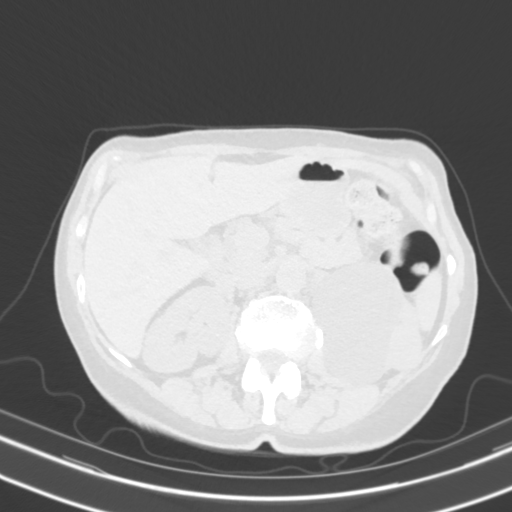
[im 23/169  lung]
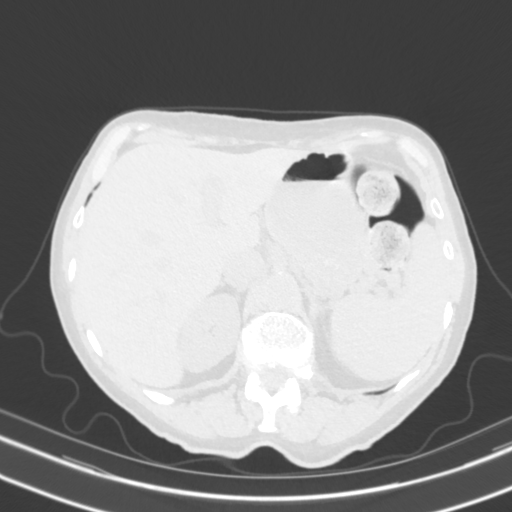
[im 39/169  lung]
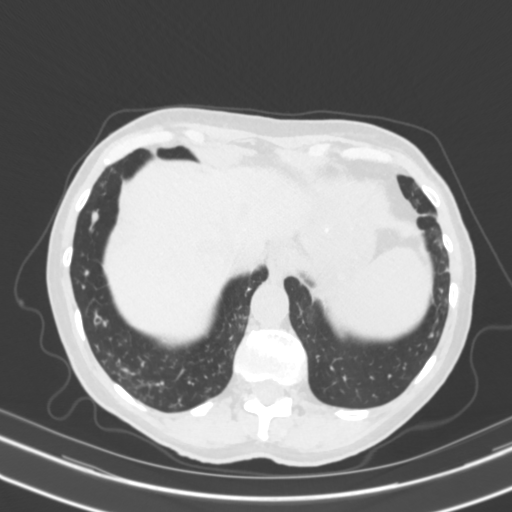
[im 54/169  lung]
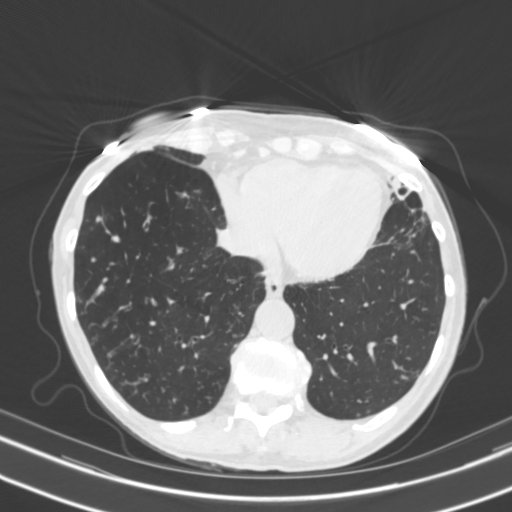
[im 62/169  mediastinal]
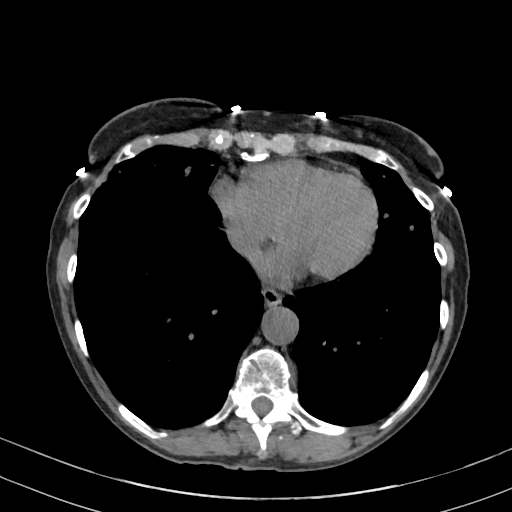
[im 62/169  lung]
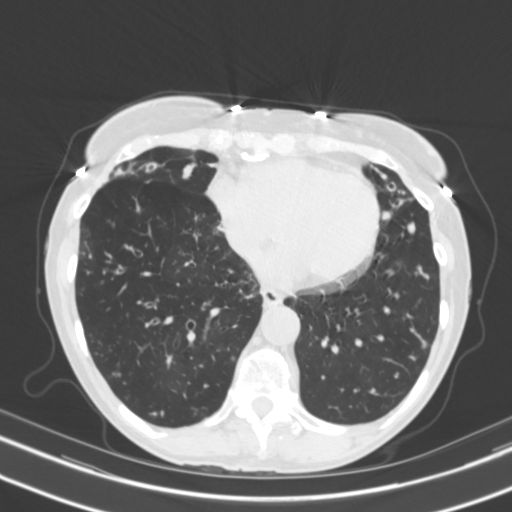
[im 77/169  lung]
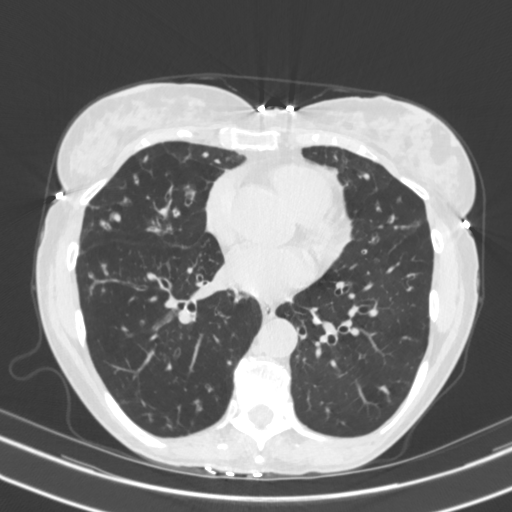
[im 92/169  lung]
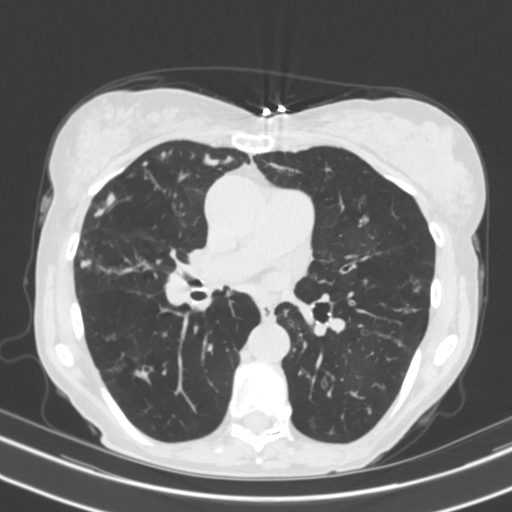
[im 107/169  lung]
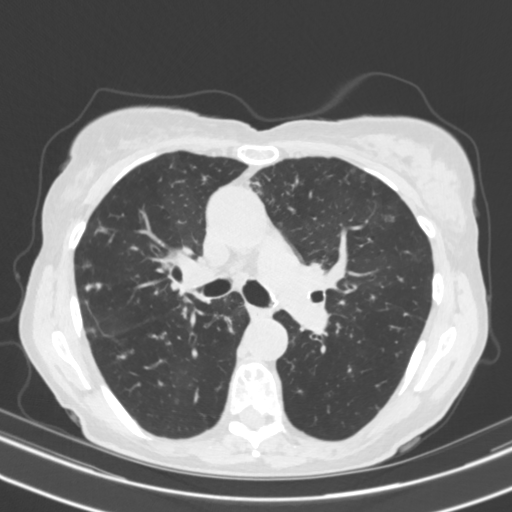
[im 115/169  mediastinal]
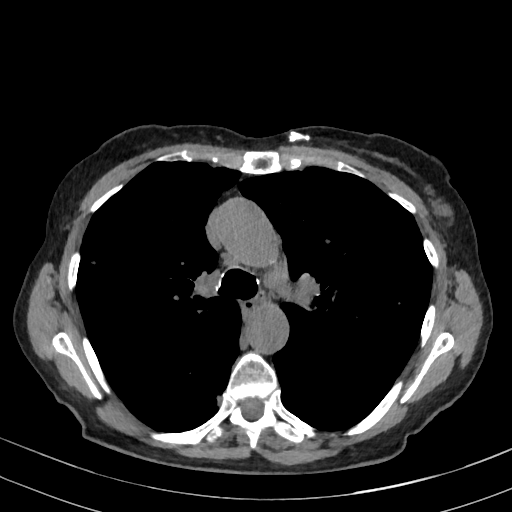
[im 115/169  lung]
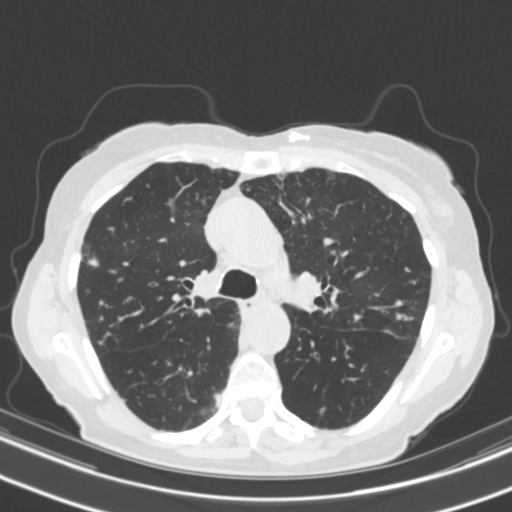
[im 130/169  lung]
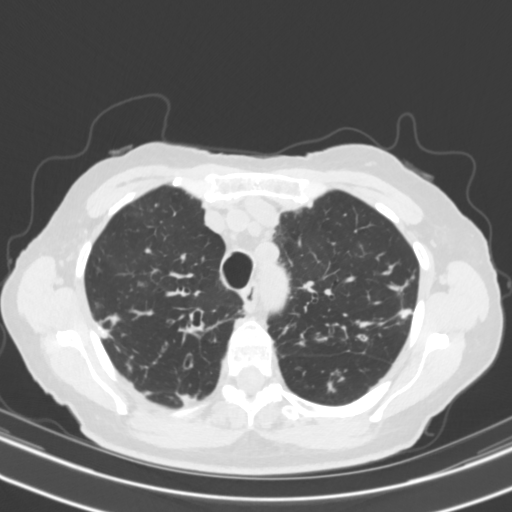
[im 146/169  lung]
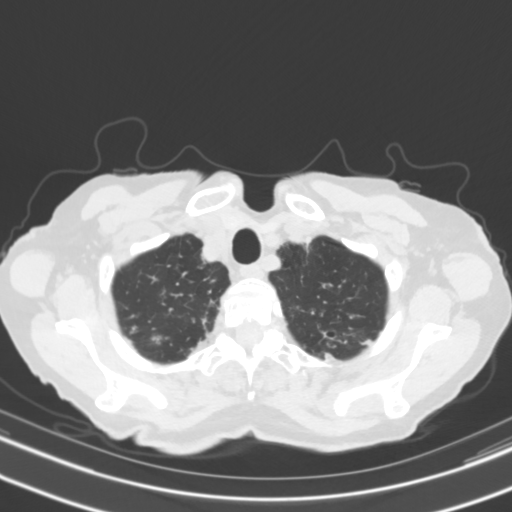
[im 161/169  lung]
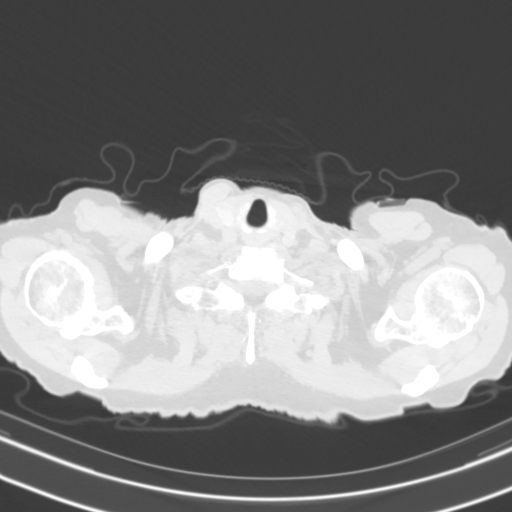

[Series 5: coronal · coronal · 0.62mm/px · 3 of 119 slices shown]
[im 24/119  lung]
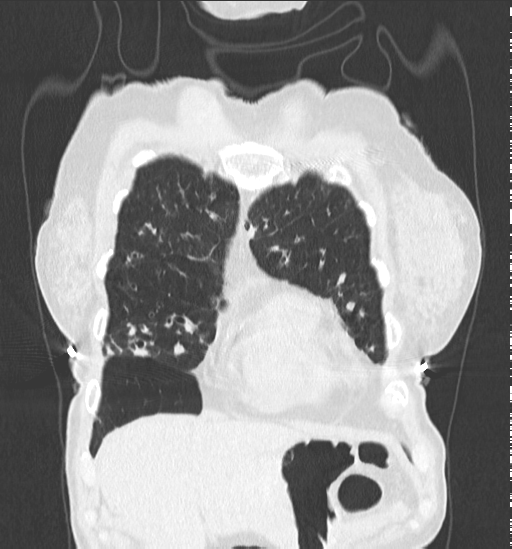
[im 48/119  lung]
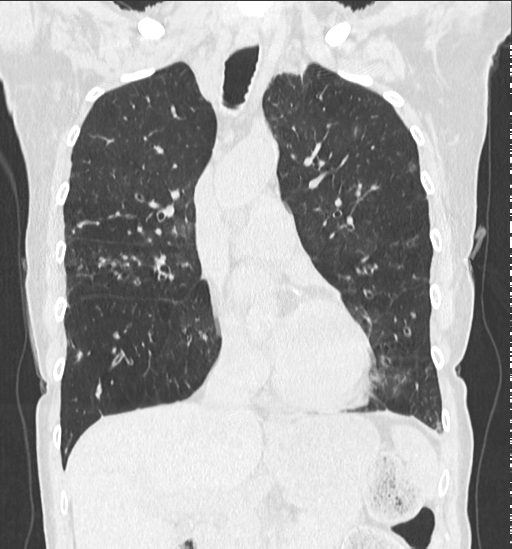
[im 71/119  lung]
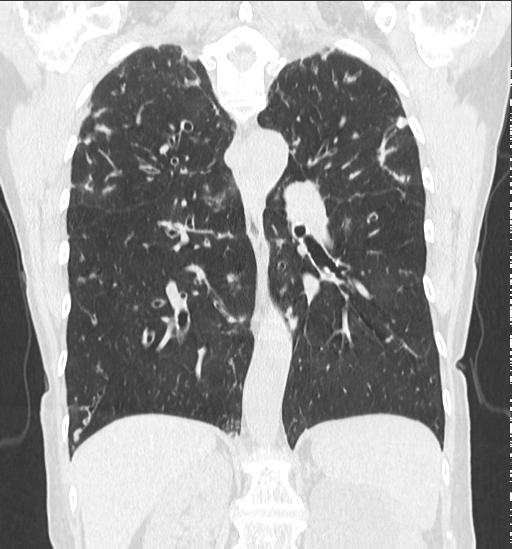

[15 of 36 positions shown; findings below may reference images not displayed]

FINDINGS: LUNGS AND PLEURA:  No significant interval change in the scattered bilateral 
small noncalcified nodules and reticulonodular opacities with peripheral 
predominance. Stable bilateral tubular bronchiectasis, bronchial wall thickening 
and mucous plugging. There are 2 foci of slight improvement in the lateral RIGHT 
lower lobe image 102 and posterior LEFT lower lobe image 94. No pleural 
effusion. 
MEDIASTINUM:  Stable small mediastinal nodes. No adenopathy. Patulous esophagus. 
Calcific coronary artery disease. Normal heart size. 
CHEST WALL/AXILLA: No mass or adenopathy.  Dense breast parenchyma. Implantable 
cardiac loop recorder. 
UPPER ABDOMEN: 7.2 cm LEFT upper pole renal cortical cyst. Otherwise 
unremarkable. 
MUSCULOSKELETAL: Multifocal degenerative change of the spine with a large 
osteophyte LEFT sixth/seventh disc space. Bilateral glenohumeral arthritis.
IMPRESSION: Stable exam. 
No significant change in the scattered bilateral small noncalcified nodules, 
reticular nodular opacities, tubular bronchiectasis, bronchial wall thickening 
and mucous plugging. Constellation of findings compatible with atypical 
mycobacterium infection. 
RADIATION DOSE REDUCTION: All CT scans are performed using radiation dose 
reduction techniques, when applicable.  Technical factors are evaluated and 
adjusted to ensure appropriate moderation of exposure.  Automated dose 
management technology is applied to adjust the radiation doses to minimize 
exposure while achieving diagnostic quality images.

## 2022-03-08 IMAGING — MR MRI CERVICAL SPINE WITHOUT CONTRAST
7 series · 27 of 48 positions shown · IV contrast (gadolinium)
Comparison: None.

________________________________________________________________________________________________ 
MRI CERVICAL SPINE WITHOUT CONTRAST, 03/08/2022 [DATE]: 
CLINICAL INDICATION: Neck pain. Right-sided that radiates to shoulder. Numbness 
and tingling in right hand.
TECHNIQUE: Multiplanar, multiecho position MR images of the cervical spine were 
performed without intravenous gadolinium enhancement. Patient was scanned on a 
3T magnet.

[Series 801: survey · axial · 10.0mm · 0.94mm/px · z∈[-137,+29]mm · 2 of 9 slices shown]
[im 1/9]
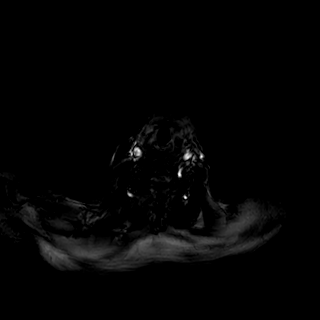
[im 9/9]
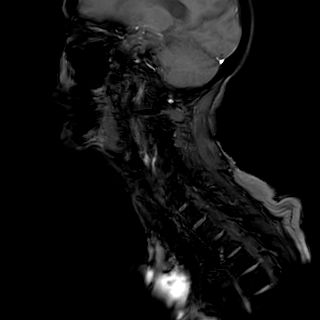

[Series 901: t2w_tse cor · coronal · 5.0mm · 0.52mm/px · 2 of 7 slices shown]
[im 1/7]
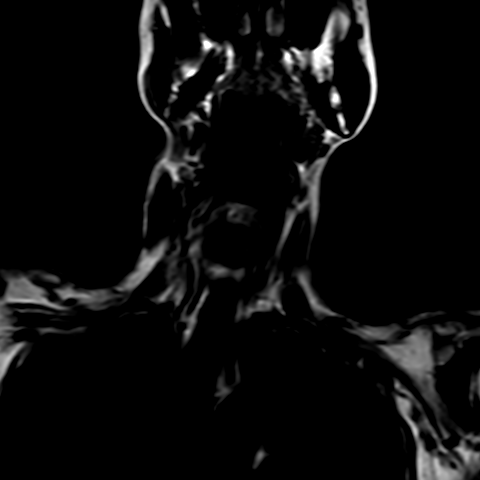
[im 7/7]
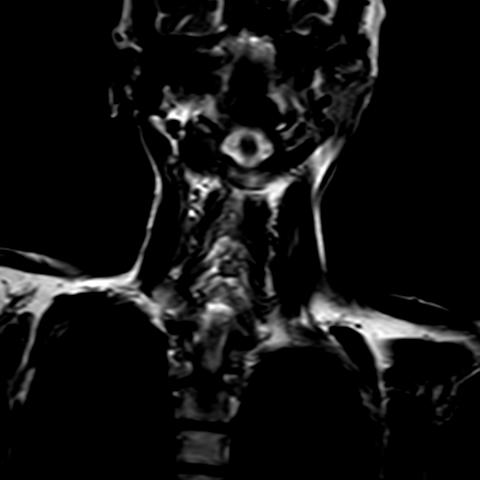

[Series 1002: st2w_tse sag fs · sagittal · 3.0mm · 0.36mm/px · 4 of 15 slices shown]
[im 1/15]
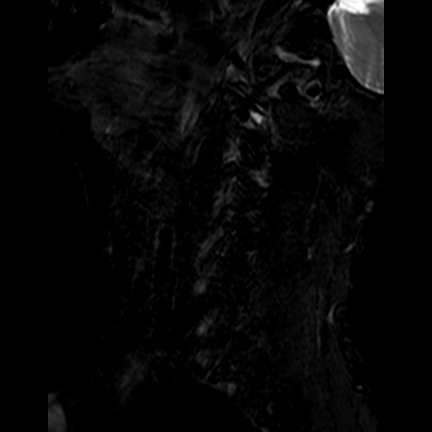
[im 5/15]
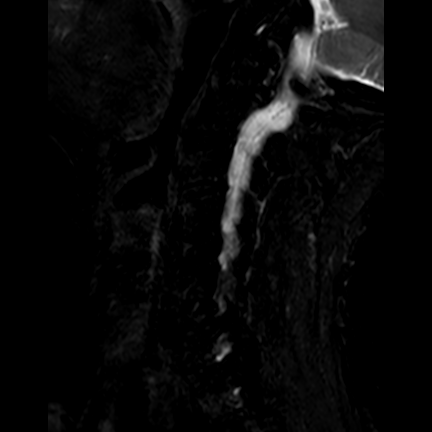
[im 10/15]
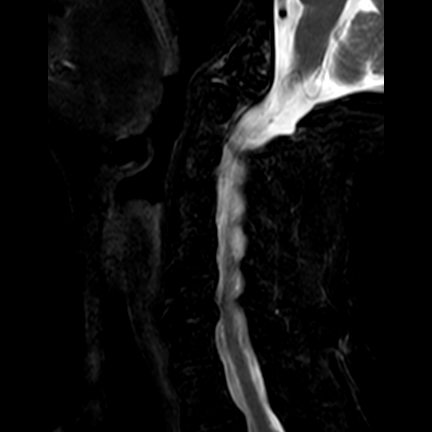
[im 15/15]
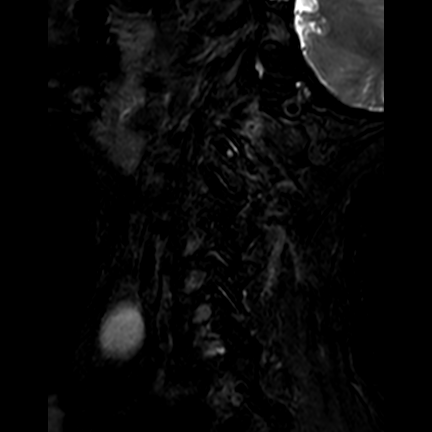

[Series 1003: st2w_tse sag · sagittal · 3.0mm · 0.36mm/px · 4 of 15 slices shown]
[im 1/15]
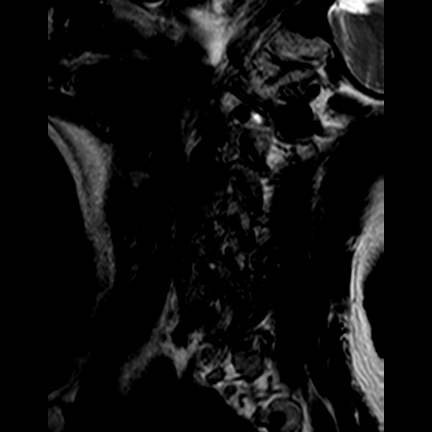
[im 5/15]
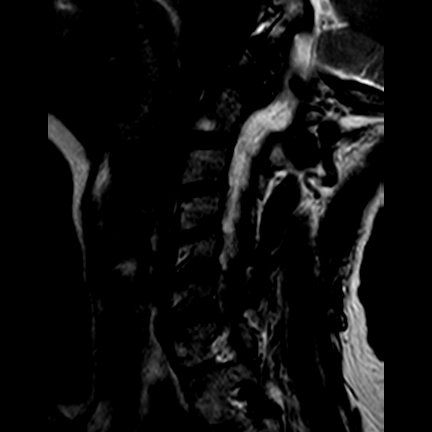
[im 10/15]
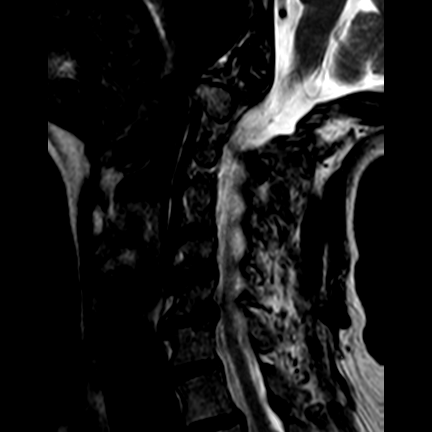
[im 15/15]
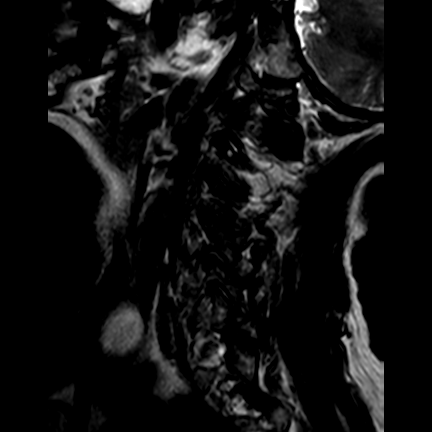

[Series 1101: t1w_tse sag · sagittal · 3.0mm · 0.44mm/px · 4 of 15 slices shown]
[im 1/15]
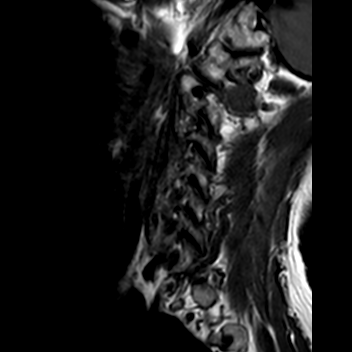
[im 5/15]
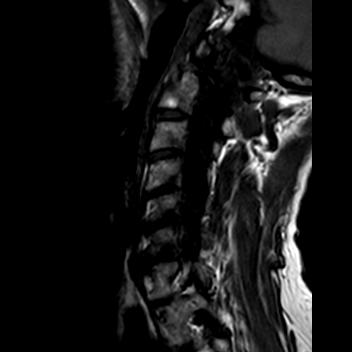
[im 10/15]
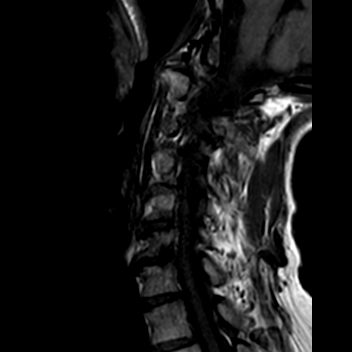
[im 15/15]
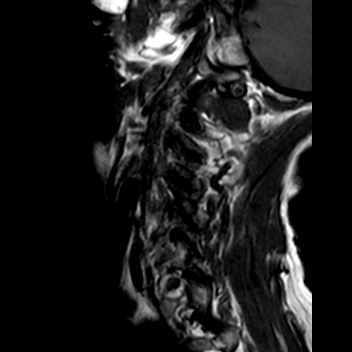

[Series 1201: t2w_drive_3d · axial · 3.0mm · 0.33mm/px · z∈[-199,-104]mm · 8 of 77 slices shown]
[im 5/77]
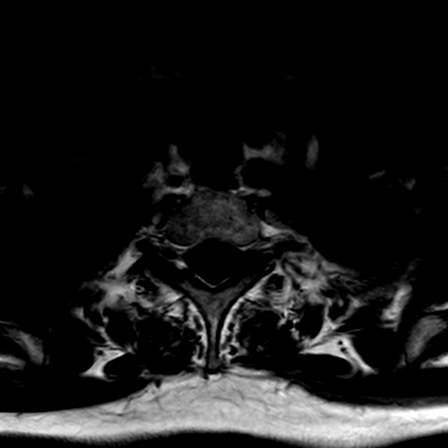
[im 13/77]
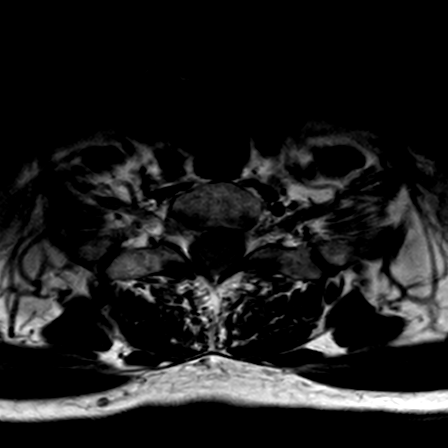
[im 25/77]
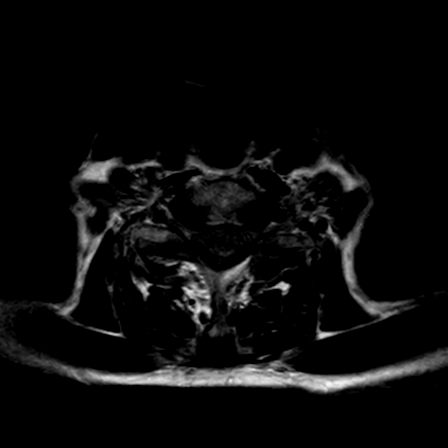
[im 33/77]
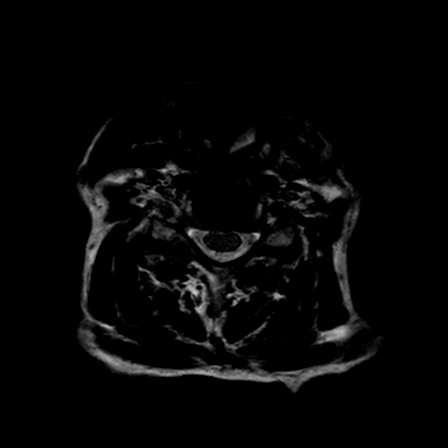
[im 45/77]
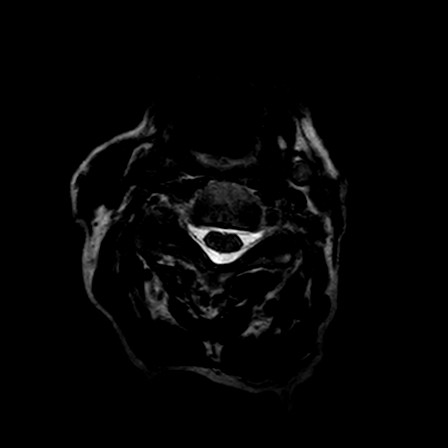
[im 53/77]
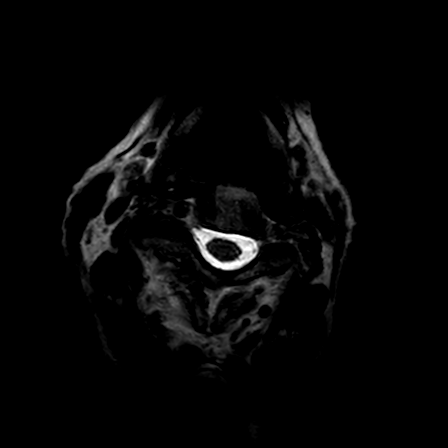
[im 65/77]
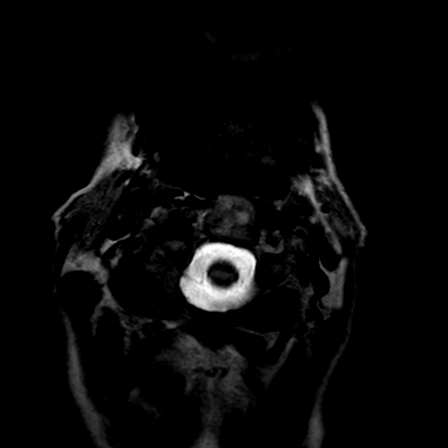
[im 73/77]
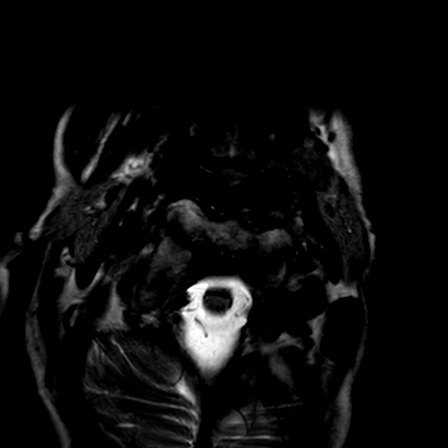

[Series 1301: t2w_tse_ax · axial · 3.0mm · 0.32mm/px · z∈[-210,-174]mm · 3 of 45 slices shown]
[im 1/45]
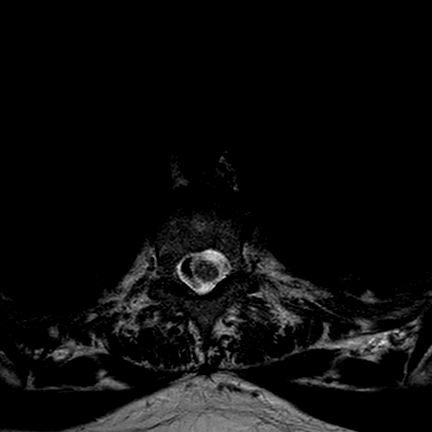
[im 9/45]
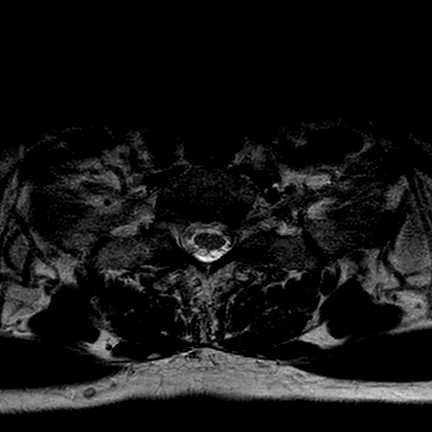
[im 13/45]
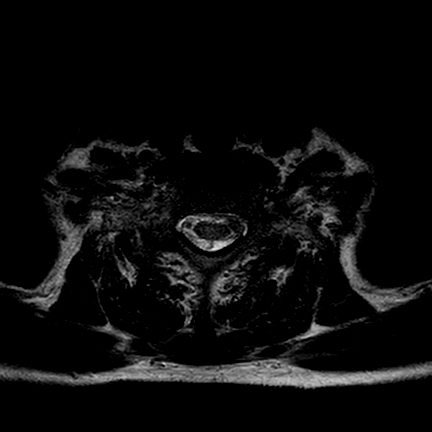

[27 of 48 positions shown; findings below may reference images not displayed]

FINDINGS: -------------------------------------------------------------------------------- 
----------------- 
GENERAL: 
ALIGNMENT: Very mild anterolisthesis of C4 on C5 and C7 on T1, leftward 
curvature of the upper cervical spine. 
VERTEBRAL BODY HEIGHT: Normal.  
MARROW SIGNAL: No focal suspect signal abnormality. 
CORD SIGNAL: Normal.  
ADDITIONAL FINDINGS: None. 
-------------------------------------------------------------------------------- 
---------------- 
SEGMENTAL: 
CRANIOCERVICAL JUNCTION: Mild narrowing caused by hypertrophic degenerative 
pannus posterior to C2 and odontoid process. 
C2-C3: Mild disc ossify complex with right uncovertebral joint hypertrophy. 
Extensive right facet hypertrophy. Ligamentum flavum hypertrophy. Mild central 
canal narrowing. No significant left neural foraminal narrowing. Mild right 
neural foraminal narrowing. 
C3-C4: Right facet hypertrophy. Left uncovertebral joint hypertrophy. No 
significant central canal or neural foraminal narrowing. 
C4-C5: Uncovering of the disc space. Right facet hypertrophy, right 
uncovertebral joint hypertrophy. No significant central canal or neural 
foraminal narrowing. 
C5-C6: Disc osteophyte complex. Bilateral uncovertebral joint hypertrophy. Right 
facet hypertrophy. Mild central canal narrowing. Mild left neural foraminal 
narrowing. No significant right neural foraminal narrowing. 
C6-C7: Disc osteophyte complex with bilateral uncovertebral joint hypertrophy. 
Mild central canal narrowing. Moderate bilateral neural foraminal narrowing. 
C7-T1: Left facet hypertrophy. No significant central canal or neural foraminal 
narrowing.  
-------------------------------------------------------------------------------- 
---------------
IMPRESSION: 1.  Discogenic/degenerative changes as above. 
2.  No moderate or severe central canal narrowing at any level.  
3.  Worst level(s) of neural foraminal narrowing: C6-C7 (moderate bilateral).

## 2022-03-08 IMAGING — MR MRI BRAIN WITHOUT CONTRAST
5 of 9 series · 25 of 48 positions shown · IV contrast (gadolinium)
Comparison: None.

________________________________________________________________________________________________ 
MRI BRAIN WITHOUT CONTRAST, 03/08/2022 [DATE]: 
CLINICAL INDICATION: Right hand and upper extremity weakness for 2 weeks. 
History of CVA.
TECHNIQUE: Multiplanar, multiecho position MR images of the brain were performed 
without intravenous gadolinium enhancement. Patient was scanned on a 3T magnet.

[Series 401: FLAIR fat-sat · axial · 5.0mm · 0.60mm/px · z∈[-72,+84]mm · 4 of 27 slices shown]
[im 1/27]
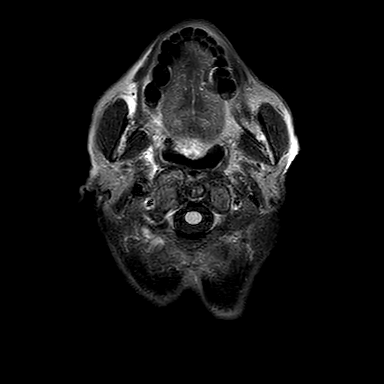
[im 9/27]
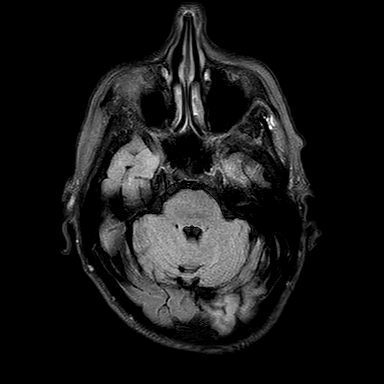
[im 18/27]
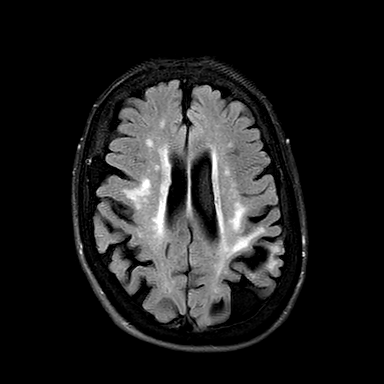
[im 27/27]
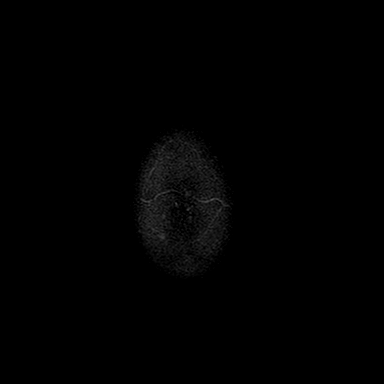

[Series 501: SWI · axial · 3.0mm · 0.53mm/px · z∈[-63,+85]mm · 8 of 100 slices shown (1 of 2)]
[im 1/100]
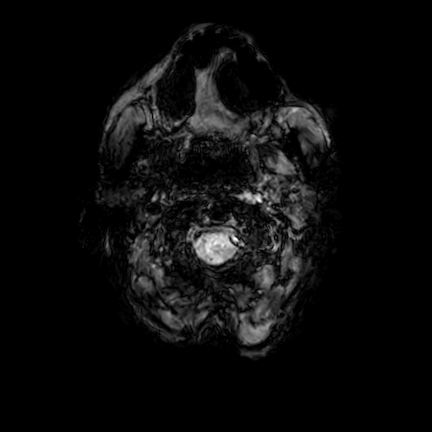
[im 16/100]
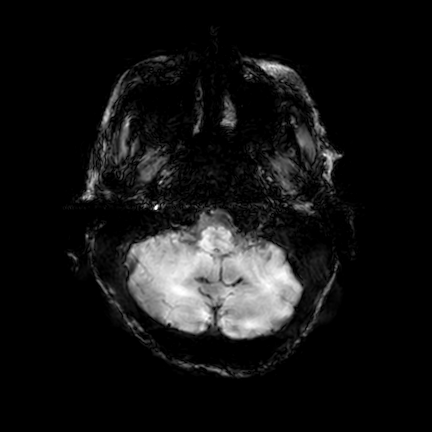
[im 31/100]
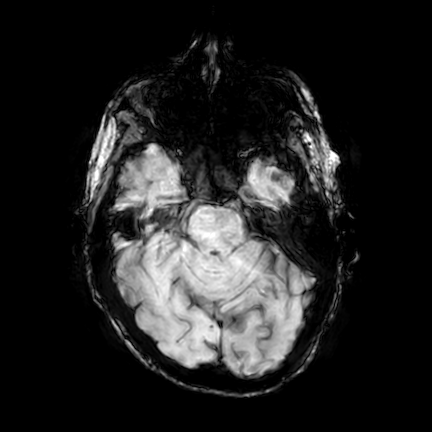
[im 46/100]
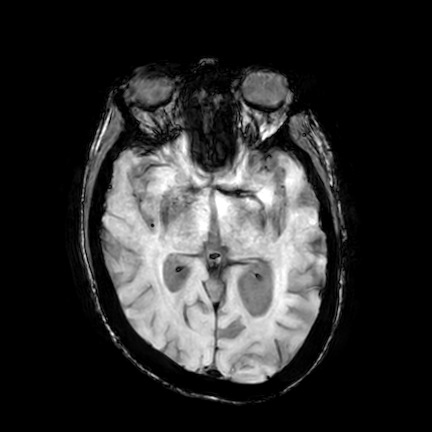
[im 54/100]
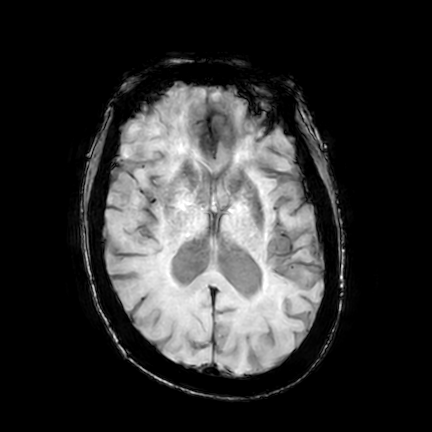
[im 69/100]
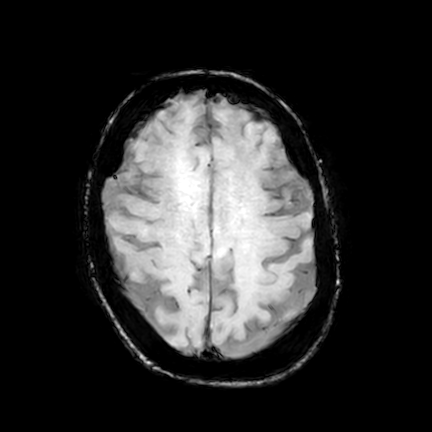
[im 84/100]
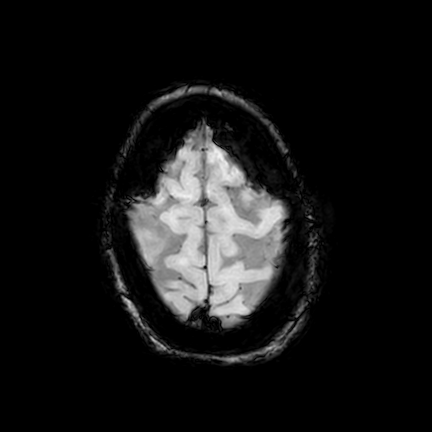
[im 100/100]
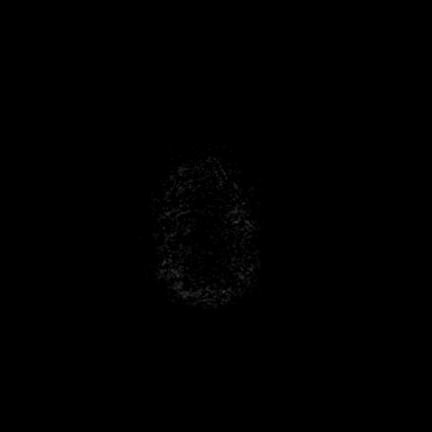

[Series 502: SWI · axial · 10.0mm · 0.53mm/px · z∈[-64,-4]mm · 4 of 78 slices shown (2 of 2)]
[im 1/78]
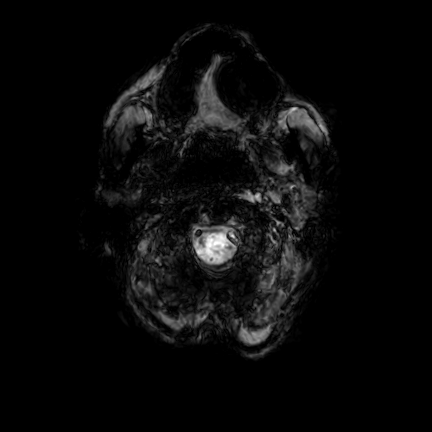
[im 16/78]
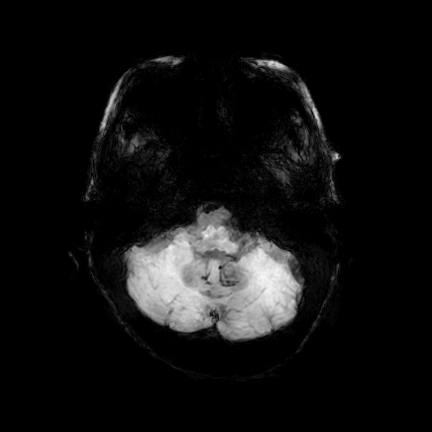
[im 24/78]
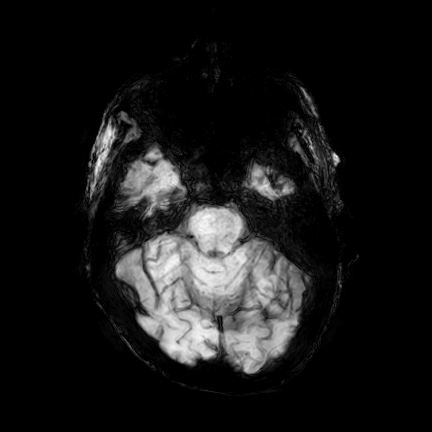
[im 31/78]
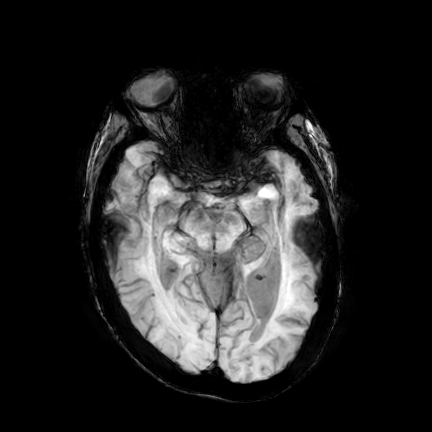

[Series 601: T2 · axial · 5.0mm · 0.41mm/px · z∈[-72,+84]mm · 4 of 27 slices shown (1 of 2)]
[im 1/27]
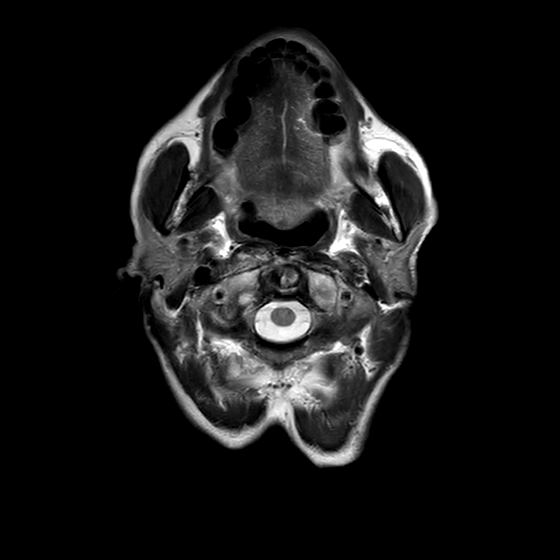
[im 9/27]
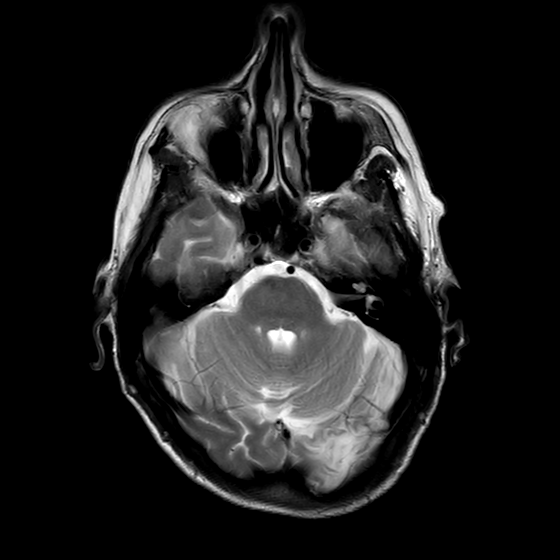
[im 18/27]
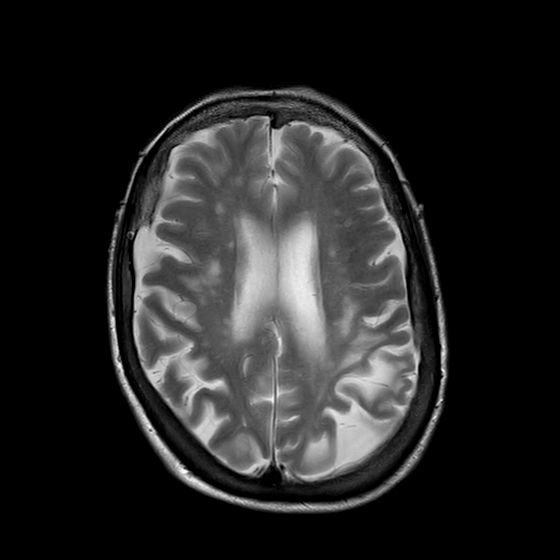
[im 27/27]
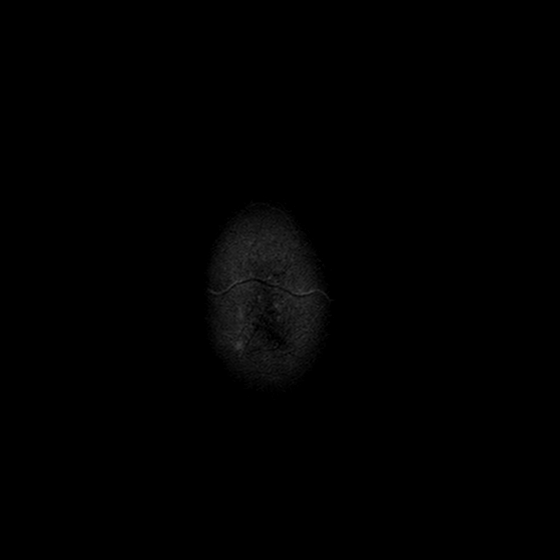

[Series 701: T2 · coronal · 4.0mm · 0.47mm/px · 5 of 36 slices shown (2 of 2)]
[im 1/36]
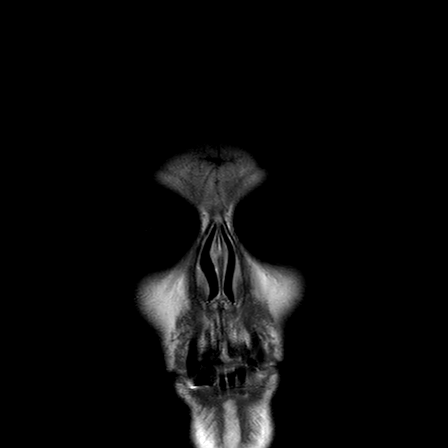
[im 9/36]
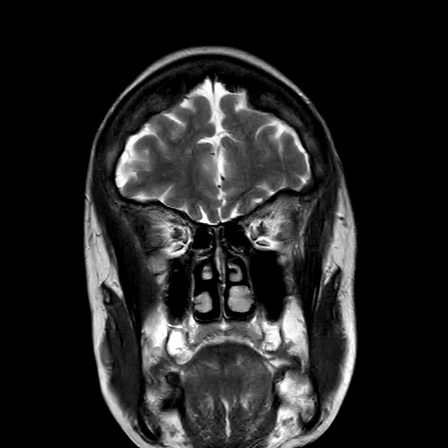
[im 18/36]
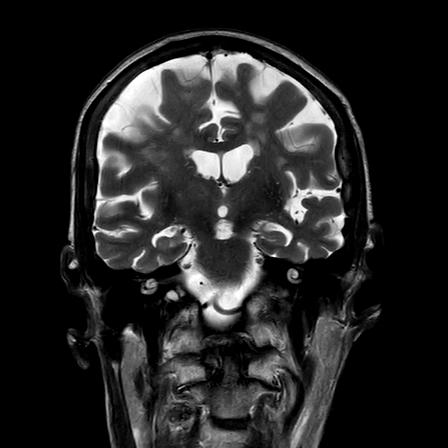
[im 27/36]
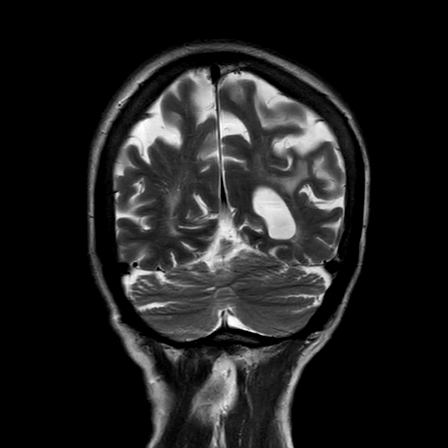
[im 36/36]
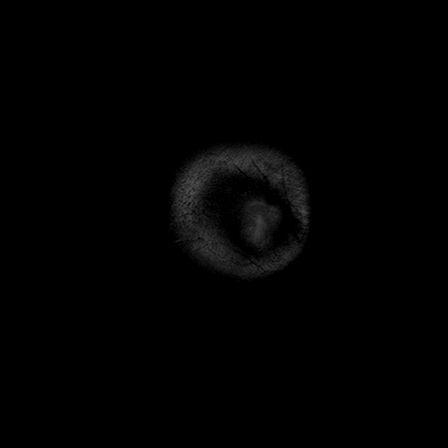

[25 of 48 positions shown; findings below may reference images not displayed]

FINDINGS: -------------------------------------------------------------------------------- 
------------------------- 
INTRACRANIAL: 
No acute ischemia. No abnormal foci of susceptibility artifact in the brain. 
Patency of intracranial vascular flow voids. Encephalomalacia along left 
occipital lobe from prior ischemic change. Additional encephalomalacia along the 
left parietal lobe from chronic ischemic change. Periventricular and deep white 
matter change, probably secondary to microangiopathy. This is moderate. No acute 
intracranial hemorrhage, mass effect, midline shift.  
-------------------------------------------------------------------------------- 
----------------------- 
OTHER: 
ORBITS/SINUSES/T-BONES:  Status post bilateral cataract extraction.  Mastoid air 
cells and middle ear cavities are grossly clear.  Visualized paranasal sinuses 
are clear. 
MARROW SIGNAL/SOFT TISSUES: No focal suspect signal abnormality.  
-------------------------------------------------------------------------------- 
-------------------
IMPRESSION: 1.  Chronic ischemic change along left occipital lobe and left parietal lobe. 
2.  Moderate right matter microangiopathic change.

## 2022-08-23 IMAGING — DX CHEST PA AND LATERAL
1 series · 2 of 2 positions shown · non-contrast
Comparison: CT scan from August 2020

________________________________________________________________________________________________ 
CLINICAL INDICATION: Cough, Unspecified.

[Series 1: PA · U · 0.14mm/px · 2 of 2 slices shown]
[im 1/2]
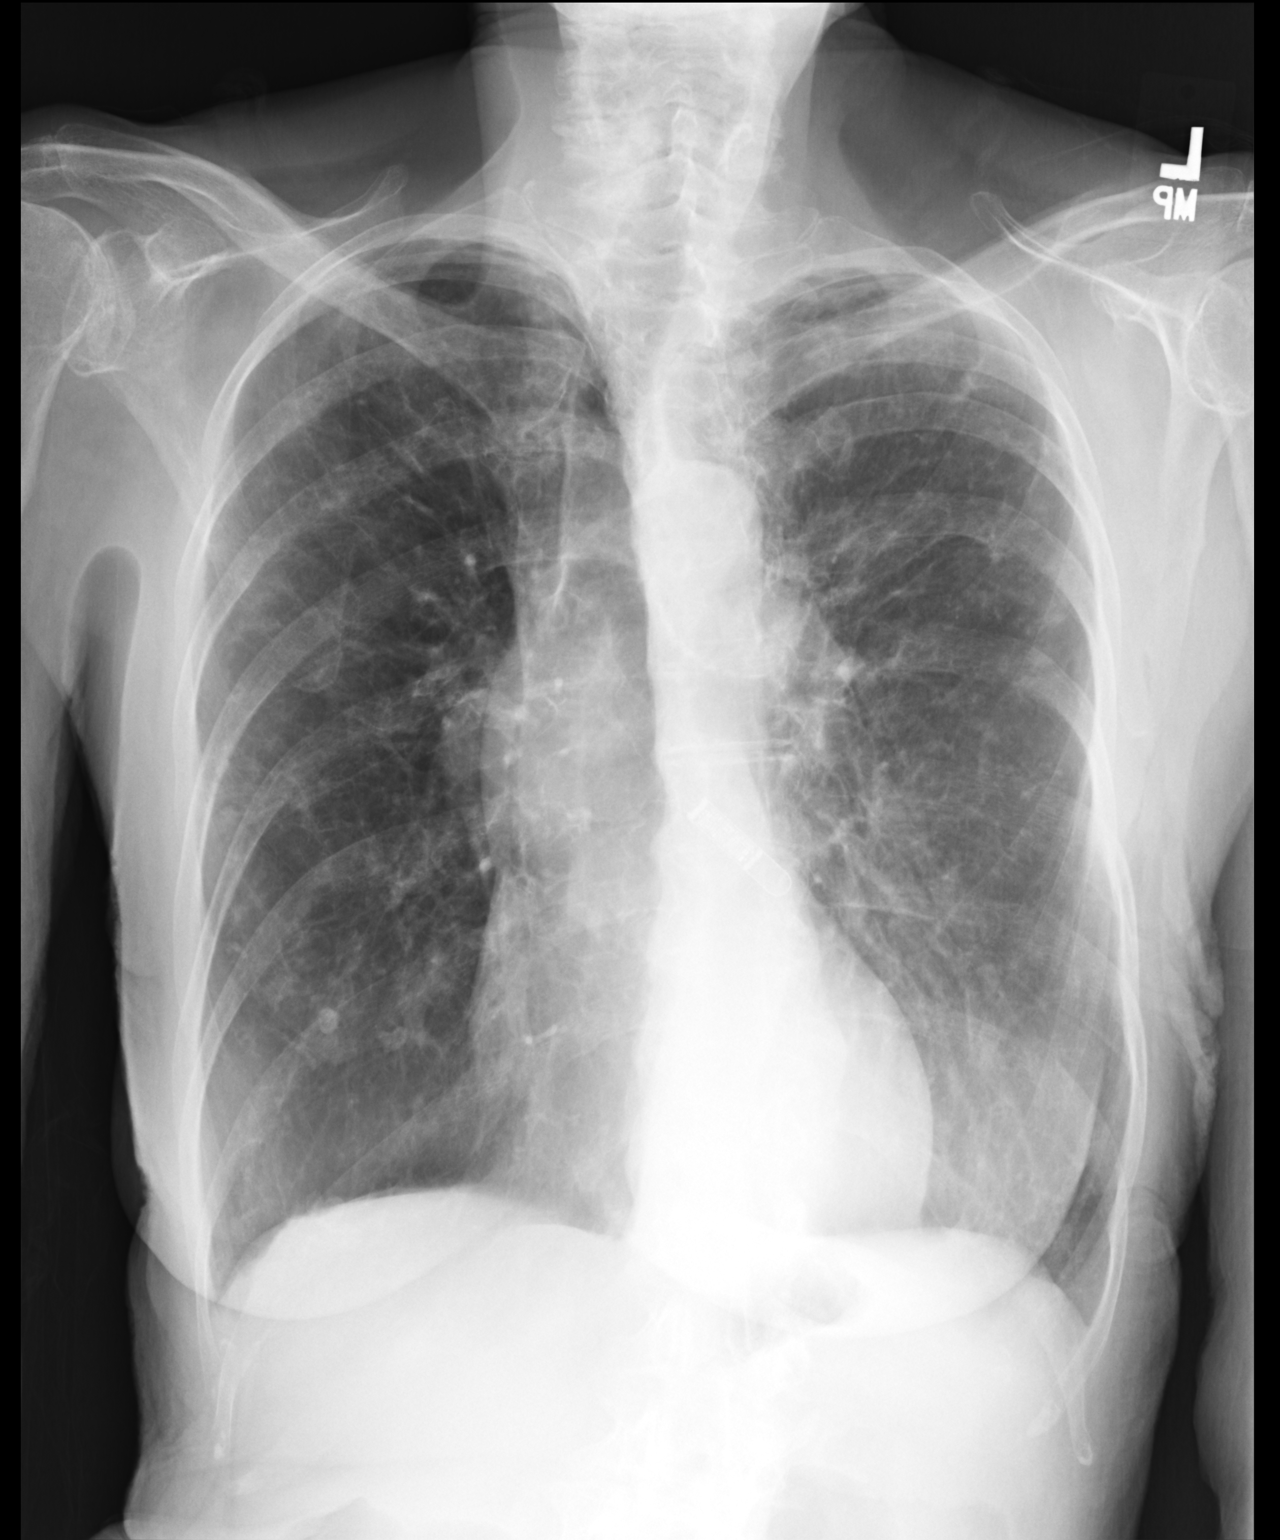
[im 2/2]
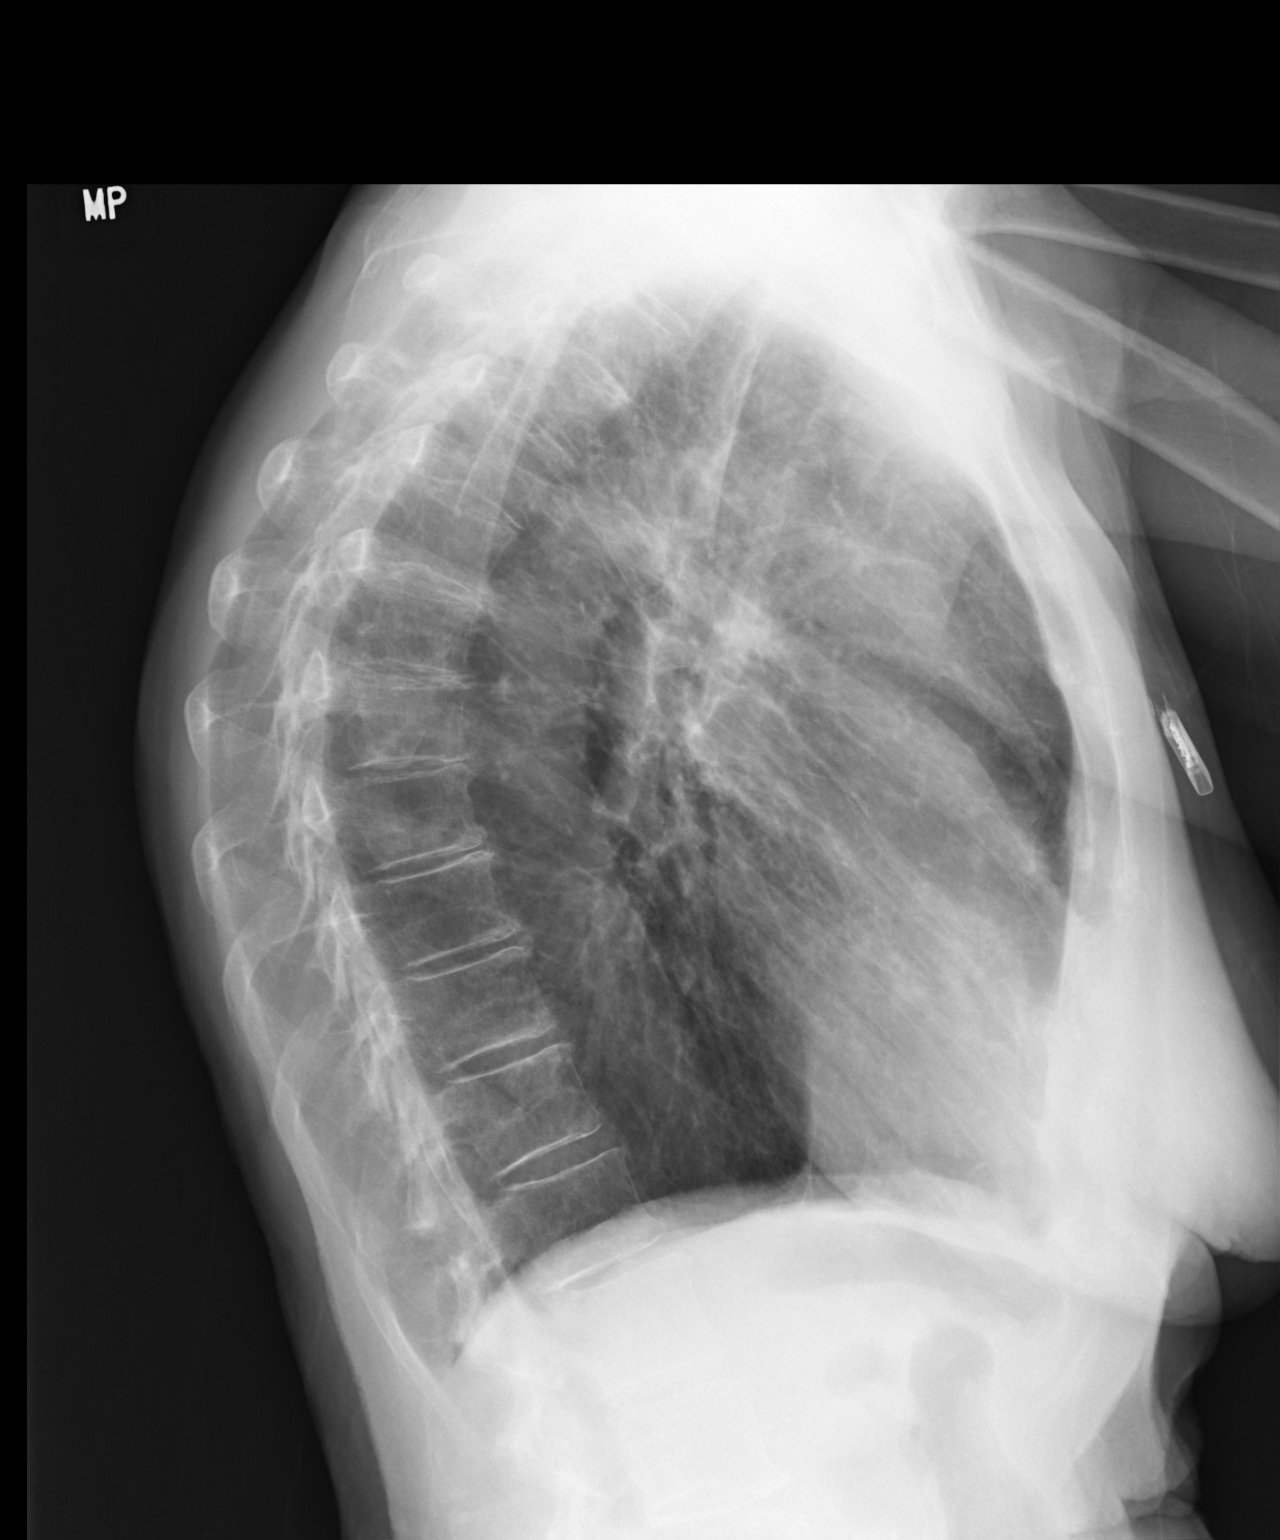

[2 of 2 positions shown; findings below may reference images not displayed]

FINDINGS: Hyperexpansion is identified. Patchy nodular densities are identified 
overlying the region of the lingula, right middle lobe and both upper lobes. 
Review of the older CT scan shows rather severe bronchiectasis. There is a 
cardiac loop recorder identified. Atherosclerotic changes and degenerative 
changes.
IMPRESSION: Known bronchiectasis with what appears to be areas of inflammation. CT would 
better define. 
Hyperexpansion, atherosclerotic changes and degenerative changes.

## 2023-09-26 ENCOUNTER — Encounter (INDEPENDENT_AMBULATORY_CARE_PROVIDER_SITE_OTHER): Payer: Self-pay
